# Patient Record
Sex: Female | Born: 1965
Health system: Southern US, Community
[De-identification: ages and names within clinical notes are randomized; demographics above are authoritative.]

## PROBLEM LIST (undated history)

## (undated) DIAGNOSIS — I10 Essential (primary) hypertension: Secondary | ICD-10-CM

## (undated) DIAGNOSIS — Z8759 Personal history of other complications of pregnancy, childbirth and the puerperium: Secondary | ICD-10-CM

## (undated) DIAGNOSIS — E559 Vitamin D deficiency, unspecified: Secondary | ICD-10-CM

## (undated) DIAGNOSIS — E039 Hypothyroidism, unspecified: Secondary | ICD-10-CM

## (undated) HISTORY — PX: OTHER SURGICAL HISTORY: SHX169

## (undated) HISTORY — DX: Vitamin D deficiency, unspecified: E55.9

## (undated) HISTORY — DX: Essential (primary) hypertension: I10

## (undated) HISTORY — PX: BACK SURGERY: SHX140

## (undated) HISTORY — DX: Hypothyroidism, unspecified: E03.9

## (undated) HISTORY — DX: Personal history of other complications of pregnancy, childbirth and the puerperium: Z87.59

---

## 2018-06-11 LAB — TSH: TSH: 6.17 — AB (ref 0.41–5.90)

## 2020-01-27 LAB — LIPID PANEL
Cholesterol: 184 (ref 0–200)
HDL: 43 (ref 35–70)
LDL Cholesterol: 113
Triglycerides: 142 (ref 40–160)

## 2020-01-27 LAB — VITAMIN D 25 HYDROXY (VIT D DEFICIENCY, FRACTURES): Vit D, 25-Hydroxy: 29

## 2020-01-27 LAB — COMPREHENSIVE METABOLIC PANEL
Calcium: 9.1 (ref 8.7–10.7)
GFR calc Af Amer: 68
GFR calc non Af Amer: 79

## 2020-01-27 LAB — CBC: RBC: 4.27 (ref 3.87–5.11)

## 2020-01-27 LAB — BASIC METABOLIC PANEL
BUN: 17 (ref 4–21)
CO2: 30 — AB (ref 13–22)
Chloride: 102 (ref 99–108)
Creatinine: 1 (ref 0.5–1.1)
Glucose: 90
Potassium: 4 (ref 3.4–5.3)
Sodium: 139 (ref 137–147)

## 2020-01-27 LAB — HEPATIC FUNCTION PANEL
ALT: 19 (ref 7–35)
AST: 18 (ref 13–35)
Alkaline Phosphatase: 69 (ref 25–125)
Bilirubin, Total: 0.2

## 2020-01-27 LAB — TSH: TSH: 2.39 (ref 0.41–5.90)

## 2020-01-27 LAB — CBC AND DIFFERENTIAL
HCT: 38 (ref 36–46)
Hemoglobin: 11.7 — AB (ref 12.0–16.0)
Platelets: 214 (ref 150–399)
WBC: 7.2

## 2020-01-27 LAB — VITAMIN B12: Vitamin B-12: 588

## 2020-05-06 LAB — RESULTS CONSOLE HPV: CHL HPV: NEGATIVE

## 2020-05-06 LAB — HM PAP SMEAR: HM Pap smear: NORMAL

## 2020-09-13 LAB — HM MAMMOGRAPHY

## 2021-03-06 DIAGNOSIS — E039 Hypothyroidism, unspecified: Secondary | ICD-10-CM | POA: Insufficient documentation

## 2021-03-06 DIAGNOSIS — E559 Vitamin D deficiency, unspecified: Secondary | ICD-10-CM | POA: Insufficient documentation

## 2021-03-06 DIAGNOSIS — Z8759 Personal history of other complications of pregnancy, childbirth and the puerperium: Secondary | ICD-10-CM | POA: Insufficient documentation

## 2021-03-06 DIAGNOSIS — I10 Essential (primary) hypertension: Secondary | ICD-10-CM | POA: Insufficient documentation

## 2021-03-07 ENCOUNTER — Other Ambulatory Visit: Payer: Self-pay

## 2021-03-07 ENCOUNTER — Ambulatory Visit (INDEPENDENT_AMBULATORY_CARE_PROVIDER_SITE_OTHER): Payer: BLUE CROSS/BLUE SHIELD | Admitting: Family Medicine

## 2021-03-07 ENCOUNTER — Encounter: Payer: Self-pay | Admitting: Family Medicine

## 2021-03-07 VITALS — BP 130/60 | HR 80 | Ht 64.0 in | Wt 182.0 lb

## 2021-03-07 DIAGNOSIS — Z7689 Persons encountering health services in other specified circumstances: Secondary | ICD-10-CM | POA: Diagnosis not present

## 2021-03-07 NOTE — Progress Notes (Signed)
Date:  03/07/2021   Name:  Stacey Casey   DOB:  04-Nov-1965   MRN:  [id number]   Chief Complaint: Establish Care  Patient is a 55 year old female who presents for a comprehensive physical exam. The patient reports the following problems: hypothyroid/ hypertension. Health maintenance has been reviewed up to date.     Lab Results  Component Value Date   CREATININE 1.0 01/27/2020   BUN 17 01/27/2020   NA 139 01/27/2020   K 4.0 01/27/2020   CL 102 01/27/2020   CO2 30 (A) 01/27/2020   Lab Results  Component Value Date   CHOL 184 01/27/2020   HDL 43 01/27/2020   LDLCALC 113 01/27/2020   TRIG 142 01/27/2020   Lab Results  Component Value Date   TSH 2.39 01/27/2020   No results found for: HGBA1C Lab Results  Component Value Date   WBC 7.2 01/27/2020   HGB 11.7 (A) 01/27/2020   HCT 38 01/27/2020   PLT 214 01/27/2020   Lab Results  Component Value Date   ALT 19 01/27/2020   AST 18 01/27/2020   ALKPHOS 69 01/27/2020     Review of Systems  Constitutional:  Negative for chills and fever.  HENT:  Negative for drooling, ear discharge, ear pain and sore throat.   Respiratory:  Negative for cough, shortness of breath and wheezing.   Cardiovascular:  Negative for chest pain, palpitations and leg swelling.  Gastrointestinal:  Negative for abdominal pain, blood in stool, constipation, diarrhea and nausea.  Endocrine: Negative for polydipsia.  Genitourinary:  Negative for dysuria, frequency, hematuria and urgency.  Musculoskeletal:  Negative for back pain, myalgias and neck pain.  Skin:  Negative for rash.  Allergic/Immunologic: Negative for environmental allergies.  Neurological:  Negative for dizziness and headaches.  Hematological:  Does not bruise/bleed easily.  Psychiatric/Behavioral:  Negative for suicidal ideas. The patient is not nervous/anxious.    Patient Active Problem List   Diagnosis Date Noted   Hypertension 03/06/2021   Hypothyroidism 03/06/2021    Vitamin D deficiency 03/06/2021   History of miscarriage 03/06/2021    Allergies  Allergen Reactions   Azithromycin Diarrhea    Past Surgical History:  Procedure Laterality Date   arthroscopy of knee     BACK SURGERY     ruptured disc in L5-S1   CESAREAN SECTION  03/2002    Social History   Tobacco Use   Smoking status: Never   Smokeless tobacco: Never  Vaping Use   Vaping Use: Never used  Substance Use Topics   Alcohol use: Yes    Comment: ocassionally   Drug use: Not Currently     Medication list has been reviewed and updated.  Current Meds  Medication Sig   calcium carbonate (TUMS - DOSED IN MG ELEMENTAL CALCIUM) 500 MG chewable tablet Chew 1 tablet by mouth daily.   levothyroxine (SYNTHROID) 75 MCG tablet Take 75 mcg by mouth daily before breakfast.   lisinopril-hydrochlorothiazide (ZESTORETIC) 20-12.5 MG tablet Take 1 tablet by mouth daily.   Multiple Vitamins-Minerals (MULTIVITAMIN WOMENS 50+ ADV PO) Take by mouth.    PHQ 2/9 Scores 03/07/2021  PHQ - 2 Score 0  PHQ- 9 Score 0    GAD 7 : Generalized Anxiety Score 03/07/2021  Nervous, Anxious, on Edge 0  Control/stop worrying 0  Worry too much - different things 0  Trouble relaxing 0  Restless 0  Easily annoyed or irritable 0  Afraid - awful might happen 0  Total GAD 7 Score 0    BP Readings from Last 3 Encounters:  03/07/21 130/60    Physical Exam Vitals and nursing note reviewed.  Constitutional:      General: She is not in acute distress.    Appearance: She is not diaphoretic.  HENT:     Head: Normocephalic and atraumatic.     Right Ear: Tympanic membrane, ear canal and external ear normal. There is no impacted cerumen.     Left Ear: Tympanic membrane, ear canal and external ear normal. There is no impacted cerumen.     Nose: Nose normal. No congestion or rhinorrhea.  Eyes:     General:        Right eye: No discharge.        Left eye: No discharge.     Conjunctiva/sclera: Conjunctivae  normal.     Pupils: Pupils are equal, round, and reactive to light.  Neck:     Thyroid: No thyromegaly.     Vascular: No JVD.  Cardiovascular:     Rate and Rhythm: Normal rate and regular rhythm.     Heart sounds: Normal heart sounds. No murmur heard.   No friction rub. No gallop.  Pulmonary:     Effort: Pulmonary effort is normal.     Breath sounds: Normal breath sounds. No wheezing or rhonchi.  Abdominal:     General: Bowel sounds are normal.     Palpations: Abdomen is soft. There is no mass.     Tenderness: There is no abdominal tenderness. There is no guarding.  Musculoskeletal:        General: Normal range of motion.     Cervical back: Normal range of motion and neck supple.  Lymphadenopathy:     Cervical: No cervical adenopathy.  Skin:    General: Skin is warm and dry.  Neurological:     Mental Status: She is alert.     Deep Tendon Reflexes: Reflexes are normal and symmetric.    Wt Readings from Last 3 Encounters:  03/07/21 182 lb (82.6 kg)    BP 130/60   Pulse 80   Ht 5\' 4"  (1.626 m)   Wt 182 lb (82.6 kg)   LMP 02/06/2021 (Exact Date)   BMI 31.24 kg/m   Assessment and Plan:  1. Establishing care with new doctor, encounter for Patient establishing care with new physician.  Patient has hypothyroidism and is supplemented with levothyroxine and is hypertensive and is currently on lisinopril.  Blood pressure is stable at this time and patient will return in a couple weeks for a matrix exam and refill of medications.

## 2021-03-16 ENCOUNTER — Encounter: Payer: Self-pay | Admitting: Family Medicine

## 2021-03-16 ENCOUNTER — Ambulatory Visit (INDEPENDENT_AMBULATORY_CARE_PROVIDER_SITE_OTHER): Payer: BLUE CROSS/BLUE SHIELD | Admitting: Family Medicine

## 2021-03-16 ENCOUNTER — Other Ambulatory Visit: Payer: Self-pay

## 2021-03-16 VITALS — BP 124/82 | HR 72 | Ht 64.0 in | Wt 179.0 lb

## 2021-03-16 DIAGNOSIS — E039 Hypothyroidism, unspecified: Secondary | ICD-10-CM | POA: Diagnosis not present

## 2021-03-16 DIAGNOSIS — Z0000 Encounter for general adult medical examination without abnormal findings: Secondary | ICD-10-CM

## 2021-03-16 NOTE — Progress Notes (Signed)
Date:  03/16/2021   Name:  Stacey Casey   DOB:  [birth year]   MRN:  [id number]   Chief Complaint: Annual Exam (biometric) and Hypothyroidism  Stacey Casey is a 55 y.o. female who presents today for her Complete Annual Exam. She feels well. She reports exercising three times a week. She reports she is sleeping well.    Thyroid Problem Presents for follow-up visit. Patient reports no anxiety, cold intolerance, constipation, depressed mood, diaphoresis, diarrhea, dry skin, fatigue, hair loss, heat intolerance, hoarse voice, leg swelling, menstrual problem, nail problem, palpitations, tremors, visual change, weight gain or weight loss. The symptoms have been stable.   Lab Results  Component Value Date   CREATININE 1.0 01/27/2020   BUN 17 01/27/2020   NA 139 01/27/2020   K 4.0 01/27/2020   CL 102 01/27/2020   CO2 30 (A) 01/27/2020   Lab Results  Component Value Date   CHOL 184 01/27/2020   HDL 43 01/27/2020   LDLCALC 113 01/27/2020   TRIG 142 01/27/2020   Lab Results  Component Value Date   TSH 2.39 01/27/2020   No results found for: HGBA1C Lab Results  Component Value Date   WBC 7.2 01/27/2020   HGB 11.7 (A) 01/27/2020   HCT 38 01/27/2020   PLT 214 01/27/2020   Lab Results  Component Value Date   ALT 19 01/27/2020   AST 18 01/27/2020   ALKPHOS 69 01/27/2020     Review of Systems  Constitutional:  Negative for chills, diaphoresis, fatigue, fever, unexpected weight change, weight gain and weight loss.  HENT: Negative.  Negative for hoarse voice.   Eyes: Negative.   Respiratory: Negative.    Cardiovascular: Negative.  Negative for palpitations.  Gastrointestinal: Negative.  Negative for constipation and diarrhea.  Endocrine: Negative.  Negative for cold intolerance and heat intolerance.  Genitourinary: Negative.  Negative for menstrual problem.  Musculoskeletal: Negative.   Allergic/Immunologic: Negative.   Neurological: Negative.  Negative for  tremors.  Hematological: Negative.   Psychiatric/Behavioral: Negative.  The patient is not nervous/anxious.    Patient Active Problem List   Diagnosis Date Noted   Hypertension 03/06/2021   Hypothyroidism 03/06/2021   Vitamin D deficiency 03/06/2021   History of miscarriage 03/06/2021    Allergies  Allergen Reactions   Azithromycin Diarrhea    Past Surgical History:  Procedure Laterality Date   arthroscopy of knee     BACK SURGERY     ruptured disc in L5-S1   CESAREAN SECTION  03/2002    Social History   Tobacco Use   Smoking status: Never   Smokeless tobacco: Never  Vaping Use   Vaping Use: Never used  Substance Use Topics   Alcohol use: Yes    Comment: ocassionally   Drug use: Not Currently     Medication list has been reviewed and updated.  Current Meds  Medication Sig   calcium carbonate (CALCIUM 600) 600 MG TABS tablet Take 600 mg by mouth daily with breakfast.   levothyroxine (SYNTHROID) 75 MCG tablet Take 75 mcg by mouth daily before breakfast.   lisinopril-hydrochlorothiazide (ZESTORETIC) 20-12.5 MG tablet Take 1 tablet by mouth daily.   Multiple Vitamins-Minerals (MULTIVITAMIN WOMENS 50+ ADV PO) Take by mouth.   [DISCONTINUED] calcium carbonate (TUMS - DOSED IN MG ELEMENTAL CALCIUM) 500 MG chewable tablet Chew 1 tablet by mouth daily.    PHQ 2/9 Scores 03/07/2021  PHQ - 2 Score 0  PHQ- 9 Score 0  GAD 7 : Generalized Anxiety Score 03/07/2021  Nervous, Anxious, on Edge 0  Control/stop worrying 0  Worry too much - different things 0  Trouble relaxing 0  Restless 0  Easily annoyed or irritable 0  Afraid - awful might happen 0  Total GAD 7 Score 0    BP Readings from Last 3 Encounters:  03/16/21 124/82  03/07/21 130/60    Physical Exam Vitals and nursing note reviewed.  Constitutional:      Appearance: She is well-developed.  HENT:     Head: Normocephalic.     Jaw: There is normal jaw occlusion.     Right Ear: Hearing, tympanic membrane  and external ear normal.     Left Ear: Hearing, tympanic membrane and external ear normal.     Nose: Nose normal. No congestion or rhinorrhea.     Mouth/Throat:     Lips: Pink.     Mouth: Mucous membranes are moist.     Dentition: Normal dentition.     Tongue: No lesions.     Palate: No mass.     Pharynx: Oropharynx is clear. Uvula midline.     Tonsils: No tonsillar exudate or tonsillar abscesses.  Eyes:     General: Lids are normal. Vision grossly intact. Gaze aligned appropriately. No scleral icterus.       Left eye: No foreign body or hordeolum.     Conjunctiva/sclera: Conjunctivae normal.     Right eye: Right conjunctiva is not injected.     Left eye: Left conjunctiva is not injected.     Pupils: Pupils are equal, round, and reactive to light.  Neck:     Thyroid: No thyroid mass, thyromegaly or thyroid tenderness.     Vascular: No JVD.     Trachea: No tracheal deviation.  Cardiovascular:     Rate and Rhythm: Normal rate and regular rhythm.     Pulses: Normal pulses.     Heart sounds: Normal heart sounds, S1 normal and S2 normal. No murmur heard. No systolic murmur is present.  No diastolic murmur is present.    No friction rub. No gallop. No S3 or S4 sounds.  Pulmonary:     Effort: Pulmonary effort is normal. No respiratory distress.     Breath sounds: Normal breath sounds. No decreased breath sounds, wheezing, rhonchi or rales.  Abdominal:     General: Bowel sounds are normal.     Palpations: Abdomen is soft. There is no hepatomegaly, splenomegaly or mass.     Tenderness: There is no abdominal tenderness. There is no guarding or rebound.  Musculoskeletal:        General: No tenderness. Normal range of motion.     Cervical back: Normal, normal range of motion and neck supple.     Thoracic back: Normal.     Lumbar back: Normal.     Right lower leg: No edema.     Left lower leg: No edema.  Lymphadenopathy:     Head:     Right side of head: No submental, submandibular or  tonsillar adenopathy.     Left side of head: No submental, submandibular or tonsillar adenopathy.     Cervical: No cervical adenopathy.     Right cervical: No superficial or deep cervical adenopathy.    Left cervical: No superficial or deep cervical adenopathy.     Upper Body:     Right upper body: No supraclavicular adenopathy.     Left upper body: No supraclavicular adenopathy.  Skin:  General: Skin is warm.     Findings: No rash.  Neurological:     General: No focal deficit present.     Mental Status: She is alert and oriented to person, place, and time.     Cranial Nerves: Cranial nerves are intact. No cranial nerve deficit.     Sensory: Sensation is intact.     Motor: Motor function is intact.     Deep Tendon Reflexes: Reflexes normal.     Reflex Scores:      Tricep reflexes are 2+ on the right side and 2+ on the left side.      Bicep reflexes are 2+ on the right side and 2+ on the left side.      Brachioradialis reflexes are 2+ on the right side and 2+ on the left side.      Patellar reflexes are 2+ on the right side and 2+ on the left side.      Achilles reflexes are 2+ on the right side and 2+ on the left side. Psychiatric:        Mood and Affect: Mood is not anxious or depressed.    Wt Readings from Last 3 Encounters:  03/16/21 179 lb (81.2 kg)  03/07/21 182 lb (82.6 kg)    BP 124/82   Pulse 72   Ht 5\' 4"  (1.626 m)   Wt 179 lb (81.2 kg)   LMP 02/06/2021 (Approximate)   BMI 30.73 kg/m   Assessment and Plan: Stacey Casey is a 55 y.o. female who presents today for her Complete Annual Exam. She feels well. She reports exercising daily. She reports she is sleeping well.  Patient's chart was reviewed previous encounters most recent labs most recent care everywhere. 1. Encounter for annual physical exam Immunizations are reviewed and recommendations provided.   Age appropriate screening tests are discussed. Counseling given for risk factor reduction  interventions.  No subjective/objective concerns noted during the history, review of systems, nor physical exam.  We will obtain lipid panel and renal function panel and nicotine screen of the urine per request of biometric instructions. - Lipid Panel With LDL/HDL Ratio - Renal Function Panel - Nicotine screen, urine  2. Hypothyroidism, unspecified type Chronic.  Controlled.  Stable.  Patient is tolerating levothyroxine 75 mcg at this time and pending thyroid function panel with TSH we will continue or adjust accordingly. - Thyroid Panel With TSH

## 2021-03-17 LAB — RENAL FUNCTION PANEL
Albumin: 4.5 g/dL (ref 3.8–4.9)
BUN/Creatinine Ratio: 15 (ref 9–23)
BUN: 15 mg/dL (ref 6–24)
CO2: 26 mmol/L (ref 20–29)
Calcium: 10.3 mg/dL — ABNORMAL HIGH (ref 8.7–10.2)
Chloride: 101 mmol/L (ref 96–106)
Creatinine, Ser: 1.01 mg/dL — ABNORMAL HIGH (ref 0.57–1.00)
Glucose: 82 mg/dL (ref 65–99)
Phosphorus: 3.3 mg/dL (ref 3.0–4.3)
Potassium: 4 mmol/L (ref 3.5–5.2)
Sodium: 141 mmol/L (ref 134–144)
eGFR: 67 mL/min/{1.73_m2} (ref >59)

## 2021-03-17 LAB — LIPID PANEL WITH LDL/HDL RATIO
Cholesterol, Total: 247 mg/dL — ABNORMAL HIGH (ref 100–199)
HDL: 49 mg/dL (ref >39)
LDL Chol Calc (NIH): 175 mg/dL — ABNORMAL HIGH (ref 0–99)
LDL/HDL Ratio: 3.6 ratio — ABNORMAL HIGH (ref 0.0–3.2)
Triglycerides: 125 mg/dL (ref 0–149)
VLDL Cholesterol Cal: 23 mg/dL (ref 5–40)

## 2021-03-17 LAB — NICOTINE SCREEN, URINE: Cotinine Ql Scrn, Ur: NEGATIVE ng/mL

## 2021-03-17 LAB — THYROID PANEL WITH TSH
Free Thyroxine Index: 1.9 (ref 1.2–4.9)
T3 Uptake Ratio: 25 % (ref 24–39)
T4, Total: 7.7 ug/dL (ref 4.5–12.0)
TSH: 3.53 u[IU]/mL (ref 0.450–4.500)

## 2021-03-17 NOTE — Progress Notes (Signed)
Pt viewed labs on Mychart. Called pt to see if she had any question. Told pt to call back if she did.  KP

## 2021-03-21 ENCOUNTER — Other Ambulatory Visit: Payer: Self-pay

## 2021-03-21 ENCOUNTER — Ambulatory Visit (INDEPENDENT_AMBULATORY_CARE_PROVIDER_SITE_OTHER): Payer: BLUE CROSS/BLUE SHIELD

## 2021-03-21 DIAGNOSIS — Z23 Encounter for immunization: Secondary | ICD-10-CM | POA: Diagnosis not present

## 2021-03-21 MED ORDER — LEVOTHYROXINE SODIUM 75 MCG PO TABS: 75.0000 ug | ORAL_TABLET | Freq: Every day | ORAL | 1 refills | Status: DC

## 2021-03-21 MED ORDER — LISINOPRIL-HYDROCHLOROTHIAZIDE 20-12.5 MG PO TABS: 1.0000 | ORAL_TABLET | Freq: Every day | ORAL | 1 refills | Status: DC

## 2021-03-21 NOTE — Progress Notes (Signed)
Patient came in today to receive Shingrix injection #1. Patient was brought to ROOM 1. Patient was given injection while sitting on the bed. Patient tolerated injection well and had no complaints. Patient informed of possible side effects and given VIS to take home.

## 2021-04-13 ENCOUNTER — Ambulatory Visit (INDEPENDENT_AMBULATORY_CARE_PROVIDER_SITE_OTHER): Payer: BLUE CROSS/BLUE SHIELD | Admitting: Family Medicine

## 2021-04-13 ENCOUNTER — Other Ambulatory Visit: Payer: Self-pay

## 2021-04-13 ENCOUNTER — Encounter: Payer: Self-pay | Admitting: Family Medicine

## 2021-04-13 VITALS — BP 130/80 | HR 72 | Ht 64.0 in | Wt 179.0 lb

## 2021-04-13 DIAGNOSIS — J302 Other seasonal allergic rhinitis: Secondary | ICD-10-CM

## 2021-04-13 DIAGNOSIS — R059 Cough, unspecified: Secondary | ICD-10-CM

## 2021-04-13 DIAGNOSIS — Z1211 Encounter for screening for malignant neoplasm of colon: Secondary | ICD-10-CM

## 2021-04-13 DIAGNOSIS — I10 Essential (primary) hypertension: Secondary | ICD-10-CM

## 2021-04-13 DIAGNOSIS — H6983 Other specified disorders of Eustachian tube, bilateral: Secondary | ICD-10-CM

## 2021-04-13 MED ORDER — BENZONATATE 200 MG PO CAPS
200.0000 mg | ORAL_CAPSULE | Freq: Two times a day (BID) | ORAL | 0 refills | Status: DC | PRN
Start: 2021-04-13 — End: 2021-05-15

## 2021-04-13 MED ORDER — LOSARTAN POTASSIUM-HCTZ 50-12.5 MG PO TABS: 1.0000 | ORAL_TABLET | Freq: Every day | ORAL | 0 refills | Status: DC

## 2021-04-13 NOTE — Progress Notes (Signed)
error 

## 2021-04-13 NOTE — Progress Notes (Signed)
Date:  04/13/2021   Name:  Stacey Casey   DOB:  06/03/1966   MRN:  [id number]   Chief Complaint: Cough (Has had cough that leads into a sinus infection or drainage for longer than she has been on the lisinopril. Once a year develops this cough)  Cough This is a chronic problem. The current episode started in the past 7 days. The problem has been waxing and waning. The problem occurs constantly. The cough is Non-productive. Associated symptoms include nasal congestion and postnasal drip. Pertinent negatives include no chest pain, chills, ear congestion, ear pain, fever, headaches, heartburn, hemoptysis, myalgias, rash, rhinorrhea, sore throat, shortness of breath, sweats, weight loss or wheezing. The symptoms are aggravated by lying down. The treatment provided mild relief. There is no history of environmental allergies.   Lab Results  Component Value Date   CREATININE 1.01 (H) 03/16/2021   BUN 15 03/16/2021   NA 141 03/16/2021   K 4.0 03/16/2021   CL 101 03/16/2021   CO2 26 03/16/2021   Lab Results  Component Value Date   CHOL 247 (H) 03/16/2021   HDL 49 03/16/2021   LDLCALC 175 (H) 03/16/2021   TRIG 125 03/16/2021   Lab Results  Component Value Date   TSH 3.530 03/16/2021   No results found for: HGBA1C Lab Results  Component Value Date   WBC 7.2 01/27/2020   HGB 11.7 (A) 01/27/2020   HCT 38 01/27/2020   PLT 214 01/27/2020   Lab Results  Component Value Date   ALT 19 01/27/2020   AST 18 01/27/2020   ALKPHOS 69 01/27/2020     Review of Systems  Constitutional:  Negative for chills, fever and weight loss.  HENT:  Positive for postnasal drip. Negative for drooling, ear discharge, ear pain, rhinorrhea and sore throat.   Respiratory:  Positive for cough. Negative for hemoptysis, shortness of breath and wheezing.   Cardiovascular:  Negative for chest pain, palpitations and leg swelling.  Gastrointestinal:  Negative for abdominal pain, blood in stool, constipation,  diarrhea, heartburn and nausea.  Endocrine: Negative for polydipsia.  Genitourinary:  Negative for dysuria, frequency, hematuria and urgency.  Musculoskeletal:  Negative for back pain, myalgias and neck pain.  Skin:  Negative for rash.  Allergic/Immunologic: Negative for environmental allergies.  Neurological:  Negative for dizziness and headaches.  Hematological:  Does not bruise/bleed easily.  Psychiatric/Behavioral:  Negative for suicidal ideas. The patient is not nervous/anxious.    Patient Active Problem List   Diagnosis Date Noted   Hypertension 03/06/2021   Hypothyroidism 03/06/2021   Vitamin D deficiency 03/06/2021   History of miscarriage 03/06/2021    Allergies  Allergen Reactions   Azithromycin Diarrhea    Past Surgical History:  Procedure Laterality Date   arthroscopy of knee     BACK SURGERY     ruptured disc in L5-S1   CESAREAN SECTION  03/2002    Social History   Tobacco Use   Smoking status: Never   Smokeless tobacco: Never  Vaping Use   Vaping Use: Never used  Substance Use Topics   Alcohol use: Yes    Comment: ocassionally   Drug use: Not Currently     Medication list has been reviewed and updated.  Current Meds  Medication Sig   calcium carbonate (OS-CAL) 600 MG TABS tablet Take 600 mg by mouth daily with breakfast.   levothyroxine (SYNTHROID) 75 MCG tablet Take 1 tablet (75 mcg total) by mouth daily before breakfast.  lisinopril-hydrochlorothiazide (ZESTORETIC) 20-12.5 MG tablet Take 1 tablet by mouth daily.   Multiple Vitamins-Minerals (MULTIVITAMIN WOMENS 50+ ADV PO) Take by mouth.    PHQ 2/9 Scores 03/07/2021  PHQ - 2 Score 0  PHQ- 9 Score 0    GAD 7 : Generalized Anxiety Score 03/07/2021  Nervous, Anxious, on Edge 0  Control/stop worrying 0  Worry too much - different things 0  Trouble relaxing 0  Restless 0  Easily annoyed or irritable 0  Afraid - awful might happen 0  Total GAD 7 Score 0    BP Readings from Last 3  Encounters:  04/13/21 130/80  03/16/21 124/82  03/07/21 130/60    Physical Exam Vitals and nursing note reviewed.  Constitutional:      Appearance: She is well-developed.  HENT:     Head: Normocephalic.     Right Ear: Ear canal and external ear normal. There is no impacted cerumen. Tympanic membrane is retracted.     Left Ear: Ear canal and external ear normal. There is no impacted cerumen. Tympanic membrane is retracted.     Nose: Congestion present.  Eyes:     General: Lids are everted, no foreign bodies appreciated. No scleral icterus.       Left eye: No foreign body or hordeolum.     Conjunctiva/sclera: Conjunctivae normal.     Right eye: Right conjunctiva is not injected.     Left eye: Left conjunctiva is not injected.     Pupils: Pupils are equal, round, and reactive to light.  Neck:     Thyroid: No thyromegaly.     Vascular: No JVD.     Trachea: No tracheal deviation.  Cardiovascular:     Rate and Rhythm: Normal rate and regular rhythm.     Heart sounds: Normal heart sounds. No murmur heard.   No friction rub. No gallop.  Pulmonary:     Effort: Pulmonary effort is normal. No respiratory distress.     Breath sounds: Normal breath sounds. No wheezing, rhonchi or rales.  Abdominal:     General: Bowel sounds are normal.     Palpations: Abdomen is soft. There is no mass.     Tenderness: There is no abdominal tenderness. There is no guarding or rebound.  Musculoskeletal:        General: No tenderness. Normal range of motion.     Cervical back: Normal range of motion and neck supple.  Lymphadenopathy:     Cervical: No cervical adenopathy.  Skin:    General: Skin is warm.     Findings: No rash.  Neurological:     Mental Status: She is alert and oriented to person, place, and time.     Cranial Nerves: No cranial nerve deficit.     Deep Tendon Reflexes: Reflexes normal.  Psychiatric:        Mood and Affect: Mood is not anxious or depressed.    Wt Readings from Last 3  Encounters:  04/13/21 179 lb (81.2 kg)  03/16/21 179 lb (81.2 kg)  03/07/21 182 lb (82.6 kg)    BP 130/80   Pulse 72   Ht 5\' 4"  (1.626 m)   Wt 179 lb (81.2 kg)   LMP 02/06/2021 (Exact Date)   BMI 30.73 kg/m   Assessment and Plan: 1. Cough New onset for this season.  Recurrent.  Described as a tickle in the back of the throat.  Having multiple clearing of the throat.  This is consistent with a postnasal drainage  and I have suggested the patient use some Sudafed 30 mg twice a day Nasacort or Flonase as directed and Mucinex DM.  In addition I have given her Tessalon Perles to suppress the cough during the day at work.  On auscultation of the lungs there is no wheezing and no increase in expiratory to inspiratory ratio so I do not think that its reactive airway disease at this time so unguinal hold on bronchodilation therapy.   2. Seasonal allergic rhinitis, unspecified trigger As noted above there may be a seasonal allergic rhinitis seen that ragweed is now starting to become the predominant pollen in the area.  We will treat with a combination of Mucinex DM nasal steroid Sudafed and Tessalon Perles - benzonatate (TESSALON) 200 MG capsule; Take 1 capsule (200 mg total) by mouth 2 (two) times daily as needed for cough.  Dispense: 30 capsule; Refill: 0  3. Dysfunction of both eustachian tubes As noted on exam and Sudafed has been suggested  4. Primary hypertension Continue.  There may be an ACE inhibitor effect going on here even though the cough seems to be remembered prior to going on ACE inhibitor.  I am going to switch over to the losartan hydrochlorothiazide combination of 50-12 0.5 and will recheck in 6 weeks to see if this may have also been an issue causing the tickling/cough sensation. - losartan-hydrochlorothiazide (HYZAAR) 50-12.5 MG tablet; Take 1 tablet by mouth daily.  Dispense: 60 tablet; Refill: 0  5. Colon cancer screening Gust with patient and she has agreed to do the FIT  testing. - Fecal occult blood, imunochemical([facility])

## 2021-04-18 ENCOUNTER — Telehealth: Payer: Self-pay

## 2021-04-18 NOTE — Telephone Encounter (Signed)
Called and reminded pt to turn in FIT test this week or next

## 2021-04-21 ENCOUNTER — Ambulatory Visit (INDEPENDENT_AMBULATORY_CARE_PROVIDER_SITE_OTHER): Payer: BLUE CROSS/BLUE SHIELD

## 2021-04-21 DIAGNOSIS — Z23 Encounter for immunization: Secondary | ICD-10-CM

## 2021-04-28 ENCOUNTER — Telehealth: Payer: BLUE CROSS/BLUE SHIELD | Admitting: Family Medicine

## 2021-04-28 ENCOUNTER — Other Ambulatory Visit: Payer: Self-pay

## 2021-04-28 DIAGNOSIS — I10 Essential (primary) hypertension: Secondary | ICD-10-CM

## 2021-04-28 MED ORDER — HYDROCHLOROTHIAZIDE 12.5 MG PO CAPS: 12.5000 mg | ORAL_CAPSULE | Freq: Every day | ORAL | 0 refills | Status: DC

## 2021-05-04 ENCOUNTER — Ambulatory Visit: Payer: BLUE CROSS/BLUE SHIELD | Admitting: Family Medicine

## 2021-05-15 ENCOUNTER — Other Ambulatory Visit: Payer: Self-pay

## 2021-05-15 ENCOUNTER — Ambulatory Visit (INDEPENDENT_AMBULATORY_CARE_PROVIDER_SITE_OTHER): Payer: BLUE CROSS/BLUE SHIELD | Admitting: Family Medicine

## 2021-05-15 ENCOUNTER — Encounter: Payer: Self-pay | Admitting: Family Medicine

## 2021-05-15 VITALS — BP 118/70 | HR 78 | Ht 64.0 in | Wt 180.0 lb

## 2021-05-15 DIAGNOSIS — I10 Essential (primary) hypertension: Secondary | ICD-10-CM

## 2021-05-15 DIAGNOSIS — E7801 Familial hypercholesterolemia: Secondary | ICD-10-CM

## 2021-05-15 MED ORDER — HYDROCHLOROTHIAZIDE 12.5 MG PO CAPS: 12.5000 mg | ORAL_CAPSULE | Freq: Every day | ORAL | 1 refills | Status: DC

## 2021-05-15 NOTE — Patient Instructions (Signed)

## 2021-05-15 NOTE — Progress Notes (Addendum)
Date:  05/15/2021   Name:  Stacey Casey   DOB:  [birth year]   MRN:  [id number]   Chief Complaint: Hypertension  Hypertension This is a chronic problem. The current episode started more than 1 year ago. The problem has been gradually improving since onset. The problem is controlled. Pertinent negatives include no anxiety, blurred vision, chest pain, headaches, malaise/fatigue, neck pain, orthopnea, palpitations, peripheral edema, PND, shortness of breath or sweats. There are no associated agents to hypertension. Past treatments include diuretics. The current treatment provides moderate improvement.   Lab Results  Component Value Date   CREATININE 1.01 (H) 03/16/2021   BUN 15 03/16/2021   NA 141 03/16/2021   K 4.0 03/16/2021   CL 101 03/16/2021   CO2 26 03/16/2021   Lab Results  Component Value Date   CHOL 247 (H) 03/16/2021   HDL 49 03/16/2021   LDLCALC 175 (H) 03/16/2021   TRIG 125 03/16/2021   Lab Results  Component Value Date   TSH 3.530 03/16/2021   No results found for: HGBA1C Lab Results  Component Value Date   WBC 7.2 01/27/2020   HGB 11.7 (A) 01/27/2020   HCT 38 01/27/2020   PLT 214 01/27/2020   Lab Results  Component Value Date   ALT 19 01/27/2020   AST 18 01/27/2020   ALKPHOS 69 01/27/2020     Review of Systems  Constitutional:  Negative for fatigue and malaise/fatigue.  Eyes:  Negative for blurred vision.  Respiratory:  Negative for shortness of breath and wheezing.   Cardiovascular:  Negative for chest pain, palpitations, orthopnea, leg swelling and PND.  Gastrointestinal:  Negative for constipation and nausea.  Musculoskeletal:  Negative for neck pain.  Neurological:  Negative for headaches.   Patient Active Problem List   Diagnosis Date Noted   Hypertension 03/06/2021   Hypothyroidism 03/06/2021   Vitamin D deficiency 03/06/2021   History of miscarriage 03/06/2021    Allergies  Allergen Reactions   Azithromycin Diarrhea    Past  Surgical History:  Procedure Laterality Date   arthroscopy of knee     BACK SURGERY     ruptured disc in L5-S1   CESAREAN SECTION  03/2002    Social History   Tobacco Use   Smoking status: Never   Smokeless tobacco: Never  Vaping Use   Vaping Use: Never used  Substance Use Topics   Alcohol use: Yes    Comment: ocassionally   Drug use: Not Currently     Medication list has been reviewed and updated.  Current Meds  Medication Sig   calcium carbonate (OS-CAL) 600 MG TABS tablet Take 600 mg by mouth daily with breakfast.   hydrochlorothiazide (MICROZIDE) 12.5 MG capsule Take 1 capsule (12.5 mg total) by mouth daily.   levothyroxine (SYNTHROID) 75 MCG tablet Take 1 tablet (75 mcg total) by mouth daily before breakfast.   Multiple Vitamins-Minerals (MULTIVITAMIN WOMENS 50+ ADV PO) Take by mouth.    PHQ 2/9 Scores 05/15/2021 03/07/2021  PHQ - 2 Score 0 0  PHQ- 9 Score 0 0    GAD 7 : Generalized Anxiety Score 05/15/2021 03/07/2021  Nervous, Anxious, on Edge 0 0  Control/stop worrying 0 0  Worry too much - different things 0 0  Trouble relaxing 0 0  Restless 0 0  Easily annoyed or irritable 0 0  Afraid - awful might happen 0 0  Total GAD 7 Score 0 0  Anxiety Difficulty Not difficult at all -  BP Readings from Last 3 Encounters:  05/15/21 118/70  04/13/21 130/80  03/16/21 124/82    Physical Exam Vitals and nursing note reviewed.  Constitutional:      General: She is not in acute distress.    Appearance: She is not diaphoretic.  HENT:     Head: Normocephalic and atraumatic.     Right Ear: Tympanic membrane and external ear normal.     Left Ear: Tympanic membrane and external ear normal.     Nose: Nose normal.  Eyes:     General:        Right eye: No discharge.        Left eye: No discharge.     Conjunctiva/sclera: Conjunctivae normal.     Pupils: Pupils are equal, round, and reactive to light.  Neck:     Thyroid: No thyromegaly.     Vascular: No JVD.   Cardiovascular:     Rate and Rhythm: Normal rate and regular rhythm.     Heart sounds: Normal heart sounds. No murmur heard.   No friction rub. No gallop.  Pulmonary:     Effort: Pulmonary effort is normal.     Breath sounds: Normal breath sounds. No wheezing or rhonchi.  Abdominal:     General: Bowel sounds are normal.     Palpations: Abdomen is soft. There is no mass.     Tenderness: There is no abdominal tenderness. There is no guarding.  Musculoskeletal:        General: Normal range of motion.     Cervical back: Normal range of motion and neck supple.  Lymphadenopathy:     Cervical: No cervical adenopathy.  Skin:    General: Skin is warm and dry.  Neurological:     Mental Status: She is alert.     Deep Tendon Reflexes: Reflexes are normal and symmetric.    Wt Readings from Last 3 Encounters:  05/15/21 180 lb (81.6 kg)  04/13/21 179 lb (81.2 kg)  03/16/21 179 lb (81.2 kg)    BP 118/70   Pulse 78   Ht 5\' 4"  (1.626 m)   Wt 180 lb (81.6 kg)   LMP 02/06/2021 (Exact Date)   SpO2 99%   BMI 30.90 kg/m   Assessment and Plan:  1. Primary hypertension Chronic.  Controlled.  Stable.  Blood pressure today is 118/70.  Patient has responded well to the hydrochlorothiazide 12.5 mg once a day.  Will check renal function panel for electrolytes and GFR and proceed to continue for 6 months and recheck in 6 months. - hydrochlorothiazide (MICROZIDE) 12.5 MG capsule; Take 1 capsule (12.5 mg total) by mouth daily.  Dispense: 90 capsule; Refill: 1 - Renal Function Panel  2. Familial hypercholesterolemia Fasting lipid noted elevated LDL we talked about a second risk factor that needs to be dealt with.  Patient has been given low-cholesterol triglyceride dietary guidelines and will follow these with the goal of losing a pound a week.  We will recheck in roughly 12 weeks to see if successful and we will repeat lipid panel fasting at that time.

## 2021-06-04 ENCOUNTER — Other Ambulatory Visit: Payer: Self-pay | Admitting: Family Medicine

## 2021-06-04 DIAGNOSIS — I10 Essential (primary) hypertension: Secondary | ICD-10-CM

## 2021-06-04 NOTE — Telephone Encounter (Signed)
dc'd 04/28/21 ineffective- Stacey Casey

## 2021-07-05 ENCOUNTER — Other Ambulatory Visit: Payer: Self-pay

## 2021-07-05 ENCOUNTER — Ambulatory Visit (INDEPENDENT_AMBULATORY_CARE_PROVIDER_SITE_OTHER): Payer: BLUE CROSS/BLUE SHIELD

## 2021-07-05 DIAGNOSIS — Z23 Encounter for immunization: Secondary | ICD-10-CM

## 2021-07-27 ENCOUNTER — Ambulatory Visit (INDEPENDENT_AMBULATORY_CARE_PROVIDER_SITE_OTHER): Payer: BLUE CROSS/BLUE SHIELD | Admitting: Family Medicine

## 2021-07-27 ENCOUNTER — Other Ambulatory Visit: Payer: Self-pay

## 2021-07-27 ENCOUNTER — Encounter: Payer: Self-pay | Admitting: Family Medicine

## 2021-07-27 ENCOUNTER — Ambulatory Visit: Payer: Self-pay

## 2021-07-27 VITALS — BP 124/88 | HR 96 | Temp 98.5°F | Ht 64.0 in | Wt 179.0 lb

## 2021-07-27 DIAGNOSIS — R6889 Other general symptoms and signs: Secondary | ICD-10-CM | POA: Diagnosis not present

## 2021-07-27 DIAGNOSIS — J0100 Acute maxillary sinusitis, unspecified: Secondary | ICD-10-CM

## 2021-07-27 LAB — POCT INFLUENZA A/B
Influenza A, POC: NEGATIVE
Influenza B, POC: NEGATIVE

## 2021-07-27 MED ORDER — AMOXICILLIN 500 MG PO CAPS: 500.0000 mg | ORAL_CAPSULE | Freq: Three times a day (TID) | ORAL | 0 refills | Status: AC

## 2021-07-27 NOTE — Progress Notes (Signed)
Date:  07/27/2021   Name:  Stacey Casey   DOB:  February 27, 1966   MRN:  [id number]   Chief Complaint: Cough  Cough This is a new (rhinorhea/coryza) problem. Episode onset: 3 days. The problem has been unchanged. The problem occurs every few hours. The cough is Productive of sputum and productive of purulent sputum. Associated symptoms include nasal congestion, postnasal drip and rhinorrhea. Pertinent negatives include no chest pain, chills, ear congestion, ear pain, fever, headaches, myalgias, rash, sore throat, shortness of breath or wheezing. Associated symptoms comments: sneezing. The symptoms are aggravated by lying down. She has tried nothing for the symptoms. The treatment provided moderate relief. There is no history of environmental allergies.   Lab Results  Component Value Date   NA 141 03/16/2021   K 4.0 03/16/2021   CO2 26 03/16/2021   GLUCOSE 82 03/16/2021   BUN 15 03/16/2021   CREATININE 1.01 (H) 03/16/2021   CALCIUM 10.3 (H) 03/16/2021   EGFR 67 03/16/2021   GFRNONAA 79 01/27/2020   Lab Results  Component Value Date   CHOL 247 (H) 03/16/2021   HDL 49 03/16/2021   LDLCALC 175 (H) 03/16/2021   TRIG 125 03/16/2021   Lab Results  Component Value Date   TSH 3.530 03/16/2021   No results found for: HGBA1C Lab Results  Component Value Date   WBC 7.2 01/27/2020   HGB 11.7 (A) 01/27/2020   HCT 38 01/27/2020   PLT 214 01/27/2020   Lab Results  Component Value Date   ALT 19 01/27/2020   AST 18 01/27/2020   ALKPHOS 69 01/27/2020   Lab Results  Component Value Date   VD25OH 29 01/27/2020     Review of Systems  Constitutional:  Negative for chills and fever.  HENT:  Positive for postnasal drip and rhinorrhea. Negative for drooling, ear discharge, ear pain and sore throat.   Respiratory:  Positive for cough. Negative for shortness of breath and wheezing.   Cardiovascular:  Negative for chest pain, palpitations and leg swelling.  Gastrointestinal:  Negative  for abdominal pain, blood in stool, constipation, diarrhea and nausea.  Endocrine: Negative for polydipsia.  Genitourinary:  Negative for dysuria, frequency, hematuria and urgency.  Musculoskeletal:  Negative for back pain, myalgias and neck pain.  Skin:  Negative for rash.  Allergic/Immunologic: Negative for environmental allergies.  Neurological:  Negative for dizziness and headaches.  Hematological:  Does not bruise/bleed easily.  Psychiatric/Behavioral:  Negative for suicidal ideas. The patient is not nervous/anxious.    Patient Active Problem List   Diagnosis Date Noted   Hypertension 03/06/2021   Hypothyroidism 03/06/2021   Vitamin D deficiency 03/06/2021   History of miscarriage 03/06/2021    Allergies  Allergen Reactions   Azithromycin Diarrhea    Past Surgical History:  Procedure Laterality Date   arthroscopy of knee     BACK SURGERY     ruptured disc in L5-S1   CESAREAN SECTION  03/2002    Social History   Tobacco Use   Smoking status: Never   Smokeless tobacco: Never  Vaping Use   Vaping Use: Never used  Substance Use Topics   Alcohol use: Yes    Comment: ocassionally   Drug use: Not Currently     Medication list has been reviewed and updated.  Current Meds  Medication Sig   calcium carbonate (OS-CAL) 600 MG TABS tablet Take 600 mg by mouth daily with breakfast.   hydrochlorothiazide (MICROZIDE) 12.5 MG capsule Take 1  capsule (12.5 mg total) by mouth daily.   levothyroxine (SYNTHROID) 75 MCG tablet Take 1 tablet (75 mcg total) by mouth daily before breakfast.   Multiple Vitamins-Minerals (MULTIVITAMIN WOMENS 50+ ADV PO) Take by mouth.    PHQ 2/9 Scores 05/15/2021 03/07/2021  PHQ - 2 Score 0 0  PHQ- 9 Score 0 0    GAD 7 : Generalized Anxiety Score 05/15/2021 03/07/2021  Nervous, Anxious, on Edge 0 0  Control/stop worrying 0 0  Worry too much - different things 0 0  Trouble relaxing 0 0  Restless 0 0  Easily annoyed or irritable 0 0  Afraid -  awful might happen 0 0  Total GAD 7 Score 0 0  Anxiety Difficulty Not difficult at all -    BP Readings from Last 3 Encounters:  05/15/21 118/70  04/13/21 130/80  03/16/21 124/82    Physical Exam Vitals and nursing note reviewed.  Constitutional:      General: She is not in acute distress.    Appearance: She is not diaphoretic.  HENT:     Head: Normocephalic and atraumatic.     Right Ear: Hearing, tympanic membrane, ear canal and external ear normal. There is no impacted cerumen.     Left Ear: Hearing, tympanic membrane, ear canal and external ear normal. There is no impacted cerumen.     Nose: Nose normal. No congestion or rhinorrhea.     Right Sinus: No maxillary sinus tenderness or frontal sinus tenderness.     Left Sinus: No maxillary sinus tenderness or frontal sinus tenderness.     Mouth/Throat:     Mouth: Mucous membranes are moist.  Eyes:     General:        Right eye: No discharge.        Left eye: No discharge.     Conjunctiva/sclera: Conjunctivae normal.     Pupils: Pupils are equal, round, and reactive to light.  Neck:     Thyroid: No thyromegaly.     Vascular: No JVD.  Cardiovascular:     Rate and Rhythm: Normal rate and regular rhythm.     Heart sounds: Normal heart sounds, S1 normal and S2 normal. No murmur heard. No systolic murmur is present.  No diastolic murmur is present.    No friction rub. No gallop. No S3 or S4 sounds.  Pulmonary:     Effort: Pulmonary effort is normal.     Breath sounds: Normal breath sounds. No wheezing or rhonchi.  Abdominal:     General: Bowel sounds are normal.     Palpations: Abdomen is soft. There is no mass.     Tenderness: There is no abdominal tenderness. There is no guarding or rebound.  Musculoskeletal:        General: Normal range of motion.     Cervical back: Normal range of motion and neck supple.  Lymphadenopathy:     Cervical: No cervical adenopathy.  Skin:    General: Skin is warm and dry.     Capillary  Refill: Capillary refill takes less than 2 seconds.  Neurological:     Mental Status: She is alert.     Deep Tendon Reflexes: Reflexes are normal and symmetric.    Wt Readings from Last 3 Encounters:  07/27/21 179 lb (81.2 kg)  05/15/21 180 lb (81.6 kg)  04/13/21 179 lb (81.2 kg)    Ht 5' 4" (1.626 m)    Wt 179 lb (81.2 kg)    BMI 30.73 kg/m  Assessment and Plan:  1. Flu-like symptoms Symptoms are new onset patient is getting ready to [place] to [place].  COVID is noted to be negative and I have encouraged her to repeat the test prior to travel influenza test is negative for AMB.  Still there is unlikely that a viral component to this. - POCT Influenza A/B  2. Acute maxillary sinusitis, recurrence not specified New onset.  Persistent.  There is no tenderness over the maxillary sinuses but there is some mild yellow postnasal discharge.  We will treat with amoxicillin 500 mg 3 times a day for 10 days for suspected sinusitis. - amoxicillin (AMOXIL) 500 MG capsule; Take 1 capsule (500 mg total) by mouth 3 (three) times daily for 10 days.  Dispense: 30 capsule; Refill: 0

## 2021-07-27 NOTE — Telephone Encounter (Signed)
°  Chief Complaint: nasal congestion- getting ready to fly out of country Symptoms: nasal congestion- turning from clear to yellow- throat irritation Frequency: 3 days Pertinent Negatives: Patient denies fever, cough Disposition: [] ED /[] Urgent Care (no appt availability in office) / [x] Appointment(In office/virtual)/ []  [facility]/ [] Home Care/ [] Refused Recommended Disposition  Additional Notes: Patient is concerned about flying out of country and getting sick- appointment made with provider to discuss treatment and medications for trip.   Reason for Disposition  Care advice for mild cough, questions about  Zinc, vitamin C, and echinacea to treat the common cold, questions about  Answer Assessment - Initial Assessment Questions 1. ONSET: "When did the nasal discharge start?"      3 days ago 2. AMOUNT: "How much discharge is there?"      Patient is clearing nasal passages 3. COUGH: "Do you have a cough?" If yes, ask: "Describe the color of your sputum" (clear, white, yellow, green)     no 4. RESPIRATORY DISTRESS: "Describe your breathing."      normal 5. FEVER: "Do you have a fever?" If Yes, ask: "What is your temperature, how was it measured, and when did it start?"     no 6. SEVERITY: "Overall, how bad are you feeling right now?" (e.g., doesn't interfere with normal activities, staying home from school/work, staying in bed)      Patient is just having nasal symptoms- so throat iiritation from drainage- but not bad 7. OTHER SYMPTOMS: "Do you have any other symptoms?" (e.g., sore throat, earache, wheezing, vomiting)     Throat irritation  8. PREGNANCY: "Is there any chance you are pregnant?" "When was your last menstrual period?"     na  Protocols used: Common Cold-A-AH

## 2021-07-27 NOTE — Telephone Encounter (Signed)
Pt called saying she has what she calls and bad cold and she is traveling on Saturday on plane.  She wants to know if a nurse can call her back and give her some advise.   Left message to call back.

## 2021-09-18 ENCOUNTER — Other Ambulatory Visit: Payer: Self-pay | Admitting: Family Medicine

## 2021-09-18 NOTE — Telephone Encounter (Signed)
Requested Prescriptions  Pending Prescriptions Disp Refills   levothyroxine (SYNTHROID) 75 MCG tablet [Pharmacy Med Name: LEVOTHYROXINE 75 MCG TABLET] 90 tablet 1    Sig: TAKE 1 TABLET BY MOUTH DAILY BEFORE BREAKFAST.     Endocrinology:  Hypothyroid Agents Passed - 09/18/2021  1:34 AM      Passed - TSH in normal range and within 360 days    TSH  Date Value Ref Range Status  03/16/2021 3.530 0.450 - 4.500 uIU/mL Final         Passed - Valid encounter within last 12 months    Recent Outpatient Visits          1 month ago Flu-like symptoms   [facility] Duanne Limerick, MD   4 months ago Primary hypertension   [facility] Duanne Limerick, MD   5 months ago Cough   [facility] Duanne Limerick, MD   6 months ago Encounter for annual physical exam   [facility] Duanne Limerick, MD   6 months ago Establishing care with new doctor, encounter for   [facility] Duanne Limerick, MD      Future Appointments            In 1 month Duanne Limerick, MD [facility], [facility]

## 2021-10-17 ENCOUNTER — Other Ambulatory Visit: Payer: Self-pay

## 2021-10-17 ENCOUNTER — Encounter: Payer: Self-pay | Admitting: Family Medicine

## 2021-10-17 ENCOUNTER — Ambulatory Visit (INDEPENDENT_AMBULATORY_CARE_PROVIDER_SITE_OTHER): Payer: BLUE CROSS/BLUE SHIELD | Admitting: Family Medicine

## 2021-10-17 VITALS — BP 136/80 | HR 80 | Ht 64.0 in | Wt 180.0 lb

## 2021-10-17 DIAGNOSIS — Z6830 Body mass index (BMI) 30.0-30.9, adult: Secondary | ICD-10-CM

## 2021-10-17 DIAGNOSIS — I10 Essential (primary) hypertension: Secondary | ICD-10-CM

## 2021-10-17 DIAGNOSIS — Z1211 Encounter for screening for malignant neoplasm of colon: Secondary | ICD-10-CM

## 2021-10-17 DIAGNOSIS — E7801 Familial hypercholesterolemia: Secondary | ICD-10-CM | POA: Diagnosis not present

## 2021-10-17 DIAGNOSIS — E034 Atrophy of thyroid (acquired): Secondary | ICD-10-CM | POA: Diagnosis not present

## 2021-10-17 MED ORDER — NA SULFATE-K SULFATE-MG SULF 17.5-3.13-1.6 GM/177ML PO SOLN
1.0000 | Freq: Once | ORAL | 0 refills | Status: AC
Start: 2021-10-17 — End: 2021-10-17

## 2021-10-17 MED ORDER — LEVOTHYROXINE SODIUM 75 MCG PO TABS: 75.0000 ug | ORAL_TABLET | Freq: Every day | ORAL | 1 refills | Status: DC

## 2021-10-17 MED ORDER — HYDROCHLOROTHIAZIDE 12.5 MG PO CAPS: 12.5000 mg | ORAL_CAPSULE | Freq: Every day | ORAL | 1 refills | Status: DC

## 2021-10-17 NOTE — Progress Notes (Signed)
? ? ?Date:  10/17/2021  ? ?Name:  Stacey Casey   DOB:  [birth year]   MRN:  [id number] ? ? ?Chief Complaint: Hypertension (Bp has been running slightly high on the bottom) and Hypothyroidism ? ?Hypertension ?This is a chronic problem. The current episode started more than 1 year ago. The problem has been gradually improving (not controlled) since onset. The problem is uncontrolled. Pertinent negatives include no anxiety, blurred vision, chest pain, headaches, malaise/fatigue, neck pain, orthopnea, palpitations, peripheral edema, PND, shortness of breath or sweats. There are no associated agents to hypertension. Past treatments include diuretics. The current treatment provides moderate improvement. There are no compliance problems.  There is no history of angina, kidney disease, CAD/MI, CVA, heart failure, left ventricular hypertrophy, PVD or retinopathy. There is no history of chronic renal disease, a hypertension causing med or renovascular disease.  ? ?Lab Results  ?Component Value Date  ? NA 141 03/16/2021  ? K 4.0 03/16/2021  ? CO2 26 03/16/2021  ? GLUCOSE 82 03/16/2021  ? BUN 15 03/16/2021  ? CREATININE 1.01 (H) 03/16/2021  ? CALCIUM 10.3 (H) 03/16/2021  ? EGFR 67 03/16/2021  ? GFRNONAA 79 01/27/2020  ? ?Lab Results  ?Component Value Date  ? CHOL 247 (H) 03/16/2021  ? HDL 49 03/16/2021  ? LDLCALC 175 (H) 03/16/2021  ? TRIG 125 03/16/2021  ? ?Lab Results  ?Component Value Date  ? TSH 3.530 03/16/2021  ? ?No results found for: HGBA1C ?Lab Results  ?Component Value Date  ? WBC 7.2 01/27/2020  ? HGB 11.7 (A) 01/27/2020  ? HCT 38 01/27/2020  ? PLT 214 01/27/2020  ? ?Lab Results  ?Component Value Date  ? ALT 19 01/27/2020  ? AST 18 01/27/2020  ? ALKPHOS 69 01/27/2020  ? ?Lab Results  ?Component Value Date  ? VD25OH 29 01/27/2020  ?  ? ?Review of Systems  ?Constitutional:  Negative for chills, fever and malaise/fatigue.  ?HENT:  Negative for drooling, ear discharge, ear pain and sore throat.   ?Eyes:  Negative for  blurred vision.  ?Respiratory:  Negative for cough, shortness of breath and wheezing.   ?Cardiovascular:  Negative for chest pain, palpitations, orthopnea, leg swelling and PND.  ?Gastrointestinal:  Negative for abdominal pain, blood in stool, constipation, diarrhea and nausea.  ?Endocrine: Negative for polydipsia.  ?Genitourinary:  Negative for dysuria, frequency, hematuria and urgency.  ?Musculoskeletal:  Negative for back pain, myalgias and neck pain.  ?Skin:  Negative for rash.  ?Allergic/Immunologic: Negative for environmental allergies.  ?Neurological:  Negative for dizziness and headaches.  ?Hematological:  Does not bruise/bleed easily.  ?Psychiatric/Behavioral:  Negative for suicidal ideas. The patient is not nervous/anxious.   ? ?Patient Active Problem List  ? Diagnosis Date Noted  ? Hypertension 03/06/2021  ? Hypothyroidism 03/06/2021  ? Vitamin D deficiency 03/06/2021  ? History of miscarriage 03/06/2021  ? ? ?Allergies  ?Allergen Reactions  ? Azithromycin Diarrhea  ? ? ?Past Surgical History:  ?Procedure Laterality Date  ? arthroscopy of knee    ? BACK SURGERY    ? ruptured disc in L5-S1  ? CESAREAN SECTION  03/2002  ? ? ?Social History  ? ?Tobacco Use  ? Smoking status: Never  ? Smokeless tobacco: Never  ?Vaping Use  ? Vaping Use: Never used  ?Substance Use Topics  ? Alcohol use: Yes  ?  Comment: ocassionally  ? Drug use: Not Currently  ? ? ? ?Medication list has been reviewed and updated. ? ?Current Meds  ?Medication Sig  ?  calcium carbonate (OS-CAL) 600 MG TABS tablet Take 600 mg by mouth daily with breakfast.  ? hydrochlorothiazide (MICROZIDE) 12.5 MG capsule Take 1 capsule (12.5 mg total) by mouth daily.  ? levothyroxine (SYNTHROID) 75 MCG tablet TAKE 1 TABLET BY MOUTH DAILY BEFORE BREAKFAST.  ? Multiple Vitamins-Minerals (MULTIVITAMIN WOMENS 50+ ADV PO) Take by mouth.  ? ? ?PHQ 2/9 Scores 10/17/2021 07/27/2021 05/15/2021 03/07/2021  ?PHQ - 2 Score 0 0 0 0  ?PHQ- 9 Score 0 1 0 0  ? ? ?GAD 7 :  Generalized Anxiety Score 10/17/2021 07/27/2021 05/15/2021 03/07/2021  ?Nervous, Anxious, on Edge 0 0 0 0  ?Control/stop worrying 0 0 0 0  ?Worry too much - different things 0 0 0 0  ?Trouble relaxing 0 0 0 0  ?Restless 0 0 0 0  ?Easily annoyed or irritable 0 0 0 0  ?Afraid - awful might happen 0 0 0 0  ?Total GAD 7 Score 0 0 0 0  ?Anxiety Difficulty Not difficult at all Not difficult at all Not difficult at all -  ? ? ?BP Readings from Last 3 Encounters:  ?10/17/21 (!) 130/92  ?07/27/21 124/88  ?05/15/21 118/70  ? ? ?Physical Exam ?Vitals and nursing note reviewed.  ?Constitutional:   ?   Appearance: She is well-developed.  ?HENT:  ?   Head: Normocephalic.  ?   Right Ear: Tympanic membrane, ear canal and external ear normal. There is no impacted cerumen.  ?   Left Ear: Tympanic membrane, ear canal and external ear normal. There is no impacted cerumen.  ?   Nose: Nose normal. No congestion or rhinorrhea.  ?Eyes:  ?   General: Lids are everted, no foreign bodies appreciated. No scleral icterus.    ?   Left eye: No foreign body or hordeolum.  ?   Conjunctiva/sclera: Conjunctivae normal.  ?   Right eye: Right conjunctiva is not injected.  ?   Left eye: Left conjunctiva is not injected.  ?   Pupils: Pupils are equal, round, and reactive to light.  ?Neck:  ?   Thyroid: No thyromegaly.  ?   Vascular: No JVD.  ?   Trachea: No tracheal deviation.  ?Cardiovascular:  ?   Rate and Rhythm: Normal rate and regular rhythm.  ?   Heart sounds: Normal heart sounds. No murmur heard. ?  No friction rub. No gallop.  ?Pulmonary:  ?   Effort: Pulmonary effort is normal. No respiratory distress.  ?   Breath sounds: Normal breath sounds. No wheezing, rhonchi or rales.  ?Chest:  ?   Chest wall: No tenderness.  ?Abdominal:  ?   General: Bowel sounds are normal.  ?   Palpations: Abdomen is soft. There is no mass.  ?   Tenderness: There is no abdominal tenderness. There is no guarding or rebound.  ?Musculoskeletal:     ?   General: No tenderness.  Normal range of motion.  ?   Cervical back: Normal range of motion and neck supple.  ?Lymphadenopathy:  ?   Cervical: No cervical adenopathy.  ?Skin: ?   General: Skin is warm.  ?   Findings: No bruising, erythema or rash.  ?Neurological:  ?   Mental Status: She is alert and oriented to person, place, and time.  ?   Cranial Nerves: No cranial nerve deficit.  ?   Deep Tendon Reflexes: Reflexes normal.  ?Psychiatric:     ?   Mood and Affect: Mood is not anxious or depressed.  ? ? ?  Wt Readings from Last 3 Encounters:  ?10/17/21 180 lb (81.6 kg)  ?07/27/21 179 lb (81.2 kg)  ?05/15/21 180 lb (81.6 kg)  ? ? ?BP (!) 130/92   Pulse 80   Ht _0  (1.626 m)   Wt 180 lb (81.6 kg)   LMP 02/06/2021   BMI 30.90 kg/m?  ? ?Assessment and Plan: ? ?1. Primary hypertension ?Chronic.  Controlled.  Stable.  Continue hydrochlorothiazide 12.5 mg once a day.  Blood pressure on repeat in the sitting proper position was 136/80.  We will check renal function panel for electrolytes and GFR. ?- hydrochlorothiazide (MICROZIDE) 12.5 MG capsule; Take 1 capsule (12.5 mg total) by mouth daily.  Dispense: 90 capsule; Refill: 1 ?- Renal Function Panel ? ?2. Hypothyroidism due to acquired atrophy of thyroid ?Chronic.  Controlled.  Stable.  Will check TSH and pending reading we will likely continue at levothyroxine 75 mcg daily. ?- levothyroxine (SYNTHROID) 75 MCG tablet; Take 1 tablet (75 mcg total) by mouth daily before breakfast.  Dispense: 90 tablet; Refill: 1 ?- TSH ? ?3. Familial hypercholesterolemia ?Chronic.  Controlled.  Stable.  Patient will continue to watch cholesterol and triglycerides with low-cholesterol dietary sheet and lipid panel will verify current status of LDL. ?- Lipid Panel With LDL/HDL Ratio ? ?4. Colon cancer screening ?Patient is referred for colon cancer screening with gastroenterology. ?- Ambulatory referral to Gastroenterology ? ?5. BMI 30.0-30.9,adult ?Health risks of being over weight were discussed and patient was  counseled on weight loss options and exercise.   ? ? ?

## 2021-10-17 NOTE — Patient Instructions (Addendum)
How to Take Your Blood Pressure ?Blood pressure measures how strongly your blood is pressing against the walls of your arteries. Arteries are blood vessels that carry blood from your heart throughout your body. You can take your blood pressure at home with a machine. ?You may need to check your blood pressure at home: ?To check if you have high blood pressure (hypertension). ?To check your blood pressure over time. ?To make sure your blood pressure medicine is working. ?Supplies needed: ?Blood pressure machine, or monitor. ?Dining room chair to sit in. ?Table or desk. ?Small notebook. ?Pencil or pen. ?How to prepare ?Avoid these things for 30 minutes before checking your blood pressure: ?Having drinks with caffeine in them, such as coffee or tea. ?Drinking alcohol. ?Eating. ?Smoking. ?Exercising. ?Do these things five minutes before checking your blood pressure: ?Go to the bathroom and pee (urinate). ?Sit in a dining chair. Do not sit on a soft couch or an armchair. ?Be quiet. Do not talk. ?How to take your blood pressure ?Follow the instructions that came with your machine. If you have a digital blood pressure monitor, these may be the instructions: ?Sit up straight. ?Place your feet on the floor. Do not cross your ankles or legs. ?Rest your left arm at the level of your heart. You may rest it on a table, desk, or chair. ?Pull up your shirt sleeve. ?Wrap the blood pressure cuff around the upper part of your left arm. The cuff should be 1 inch (2.5 cm) above your elbow. It is best to wrap the cuff around bare skin. ?Fit the cuff snugly around your arm. You should be able to place only one finger between the cuff and your arm. ?Place the cord so that it rests in the bend of your elbow. ?Press the power button. ?Sit quietly while the cuff fills with air and loses air. ?Write down the numbers on the screen. ?Wait 2-3 minutes and then repeat steps 1-10. ?What do the numbers mean? ?Two numbers make up your blood  pressure. The first number is called systolic pressure. The second is called diastolic pressure. An example of a blood pressure reading is "120 over 80" (or 120/80). ?If you are an adult and do not have a medical condition, use this guide to find out if your blood pressure is normal: ?Normal ?First number: below 120. ?Second number: below 80. ?Elevated ?First number: 120-129. ?Second number: below 80. ?Hypertension stage 1 ?First number: 130-139. ?Second number: 80-89. ?Hypertension stage 2 ?First number: 140 or above. ?Second number: 90 or above. ?Your blood pressure is above normal even if only the first or only the second number is above normal. ?Follow these instructions at home: ?Medicines ?Take over-the-counter and prescription medicines only as told by your doctor. ?Tell your doctor if your medicine is causing side effects. ?General instructions ?Check your blood pressure as often as your doctor tells you to. ?Check your blood pressure at the same time every day. ?Take your monitor to your next doctor's appointment. Your doctor will: ?Make sure you are using it correctly. ?Make sure it is working right. ?Understand what your blood pressure numbers should be. ?Keep all follow-up visits as told by your doctor. This is important. ?General tips ?You will need a blood pressure machine, or monitor. Your doctor can suggest a monitor. You can buy one at a drugstore or online. When choosing one: ?Choose one with an arm cuff. ?Choose one that wraps around your upper arm. Only one finger should fit between   your arm and the cuff. ?Do not choose one that measures your blood pressure from your wrist or finger. ?Where to find more information ?American Heart Association: www.heart.org ?Contact a doctor if: ?Your blood pressure keeps being high. ?Your blood pressure is suddenly low. ?Get help right away if: ?Your first blood pressure number is higher than 180. ?Your second blood pressure number is higher than  120. ?Summary ?Check your blood pressure at the same time every day. ?Avoid caffeine, alcohol, smoking, and exercise for 30 minutes before checking your blood pressure. ?Make sure you understand what your blood pressure numbers should be. ?This information is not intended to replace advice given to you by your health care provider. Make sure you discuss any questions you have with your health care provider. ?Document Revised: 06/01/2020 Document Reviewed: 07/17/2019 ?Elsevier Patient Education ? 2022 Elsevier Inc. ?GUIDELINES FOR  ?LOW-CHOLESTEROL, LOW-TRIGLYCERIDE DIETS  ?  ?FOODS TO USE  ? ?MEATS, FISH Choose lean meats (chicken, [place], veal, and non-fatty cuts of beef with excess fat trimmed; one serving = 3 oz of cooked meat). Also, fresh or frozen fish, canned fish packed in water, and shellfish (lobster, crabs, shrimp, and oysters). Limit use to no more than one serving of one of these per week. Shellfish are high in cholesterol but low in saturated fat and should be used sparingly. Meats and fish should be broiled (pan or oven) or baked on a rack.  ?EGGS Egg substitutes and egg whites (use freely). Egg yolks (limit two per week).  ?FRUITS Eat three servings of fresh fruit per day (1 serving = ? cup). Be sure to have at least one citrus fruit daily. Frozen and canned fruit with no sugar or syrup added may be used.  ?VEGETABLES Most vegetables are not limited (see next page). One dark-green (string beans, escarole) or one deep yellow (squash) vegetable is recommended daily. Cauliflower, broccoli, and celery, as well as potato skins, are recommended for their fiber content. (Fiber is associated with cholesterol reduction) It is preferable to steam vegetables, but they may be boiled, strained, or braised with polyunsaturated vegetable oil (see below).  ?BEANS Dried peas or beans (1 serving = ? cup) may be used as a bread substitute.  ?NUTS Almonds, walnuts, and peanuts may be used sparingly  ?(1 serving = 1  Tablespoonful). Use pumpkin, sesame, or sunflower seeds.  ?BREADS, GRAINS One roll or one slice of whole grain or enriched bread may be used, or three soda crackers or four pieces of melba toast as a substitute. Spaghetti, rice or noodles (? cup) or ? large ear of corn may be used as a bread substitute. In preparing these foods do not use butter or shortening, use soft margarine. Also use egg and sugar substitutes.  Choose high fiber grains, such as oats and whole wheat.  ?CEREALS Use ? cup of hot cereal or ? cup of cold cereal per day. Add a sugar substitute if desired, with 99% fat free or skim milk.  ?MILK PRODUCTS Always use 99% fat free or skim milk, dairy products such as low fat cheeses (farmer's uncreamed diet cottage), low-fat yogurt, and powdered skim milk.  ?FATS, OILS Use soft (not stick) margarine; vegetable oils that are high in polyunsaturated fats (such as safflower, sunflower, soybean, corn, and cottonseed). Always refrigerate meat drippings to harden the fat and remove it before preparing gravies  ?DESSERTS, SNACKS Limit to two servings per day; substitute each serving for a bread/cereal serving: ice milk, water sherbet (1/4 cup);  unflavored gelatin or gelatin flavored with sugar substitute (1/3 cup); pudding prepared with skim milk (1/2 cup); egg white souffl?s; unbuttered popcorn (1 ? cups). Substitute carob for chocolate.  ?BEVERAGES Fresh fruit juices (limit 4 oz per day); black coffee, plain or herbal teas; soft drinks with sugar substitutes; club soda, preferably salt-free; cocoa made with skim milk or nonfat dried milk and water (sugar substitute added if desired); clear broth. Alcohol: limit two servings per day (see second page).  ?MISCELLANEOUS ? You may use the following freely: vinegar, spices, herbs, nonfat bouillon, mustard, Worcestershire sauce, soy sauce, flavoring essence.  ? ? ? ? ? ? ? ? ? ? ? ? ? ? ? ? ?GUIDELINES FOR  ?LOW-CHOLESTEROL, LOW TRIGLYCERIDE DIETS  ?  ?FOODS TO AVOID   ? ?MEATS, FISH Marbled beef, pork, bacon, sausage, and other pork products; fatty fowl (duck, goose); skin and fat of [place] and chicken; processed meats; luncheon meats (salami, bologna); frankfurters and fast-food hamburger

## 2021-10-17 NOTE — Progress Notes (Signed)
Gastroenterology Pre-Procedure Review ? ?Request Date: 11/21/2021 ?Requesting Physician: Dr. Servando Snare ? ?PATIENT REVIEW QUESTIONS: The patient responded to the following health history questions as indicated:   ? ?1. Are you having any GI issues? no ?2. Do you have a personal history of Polyps? no ?3. Do you have a family history of Colon Cancer or Polyps? no ?4. Diabetes Mellitus? no ?5. Joint replacements in the past 12 months?no ?6. Major health problems in the past 3 months?no ?7. Any artificial heart valves, MVP, or defibrillator?no ?   ?MEDICATIONS & ALLERGIES:    ?Patient reports the following regarding taking any anticoagulation/antiplatelet therapy:   ?Plavix, Coumadin, Eliquis, Xarelto, Lovenox, Pradaxa, Brilinta, or Effient? no ?Aspirin? no ? ?Patient confirms/reports the following medications:  ?Current Outpatient Medications  ?Medication Sig Dispense Refill  ? calcium carbonate (OS-CAL) 600 MG TABS tablet Take 600 mg by mouth daily with breakfast.    ? hydrochlorothiazide (MICROZIDE) 12.5 MG capsule Take 1 capsule (12.5 mg total) by mouth daily. 90 capsule 1  ? levothyroxine (SYNTHROID) 75 MCG tablet Take 1 tablet (75 mcg total) by mouth daily before breakfast. 90 tablet 1  ? Multiple Vitamins-Minerals (MULTIVITAMIN WOMENS 50+ ADV PO) Take by mouth.    ? ?No current facility-administered medications for this visit.  ? ? ?Patient confirms/reports the following allergies:  ?Allergies  ?Allergen Reactions  ? Azithromycin Diarrhea  ? ? ?No orders of the defined types were placed in this encounter. ? ? ?AUTHORIZATION INFORMATION ?Primary Insurance: ?1D#: ?Group #: ? ?Secondary Insurance: ?1D#: ?Group #: ? ?SCHEDULE INFORMATION: ?Date: 11/21/2021 ?Time: ?Location: [facility] ? ?

## 2021-10-18 LAB — RENAL FUNCTION PANEL
Albumin: 4.6 g/dL (ref 3.8–4.9)
BUN/Creatinine Ratio: 15 (ref 9–23)
BUN: 15 mg/dL (ref 6–24)
CO2: 27 mmol/L (ref 20–29)
Calcium: 9.9 mg/dL (ref 8.7–10.2)
Chloride: 100 mmol/L (ref 96–106)
Creatinine, Ser: 0.97 mg/dL (ref 0.57–1.00)
Glucose: 86 mg/dL (ref 70–99)
Phosphorus: 2.9 mg/dL — ABNORMAL LOW (ref 3.0–4.3)
Potassium: 3.8 mmol/L (ref 3.5–5.2)
Sodium: 143 mmol/L (ref 134–144)
eGFR: 69 mL/min/{1.73_m2} (ref >59)

## 2021-10-18 LAB — TSH: TSH: 4.27 u[IU]/mL (ref 0.450–4.500)

## 2021-10-18 LAB — LIPID PANEL WITH LDL/HDL RATIO
Cholesterol, Total: 235 mg/dL — ABNORMAL HIGH (ref 100–199)
HDL: 55 mg/dL (ref >39)
LDL Chol Calc (NIH): 157 mg/dL — ABNORMAL HIGH (ref 0–99)
LDL/HDL Ratio: 2.9 ratio (ref 0.0–3.2)
Triglycerides: 130 mg/dL (ref 0–149)
VLDL Cholesterol Cal: 23 mg/dL (ref 5–40)

## 2021-10-19 ENCOUNTER — Ambulatory Visit: Payer: BLUE CROSS/BLUE SHIELD | Admitting: Family Medicine

## 2021-11-09 ENCOUNTER — Other Ambulatory Visit: Payer: Self-pay

## 2021-11-13 ENCOUNTER — Ambulatory Visit: Payer: BLUE CROSS/BLUE SHIELD | Admitting: Family Medicine

## 2021-11-20 ENCOUNTER — Encounter: Payer: Self-pay | Admitting: Gastroenterology

## 2021-11-21 ENCOUNTER — Ambulatory Visit: Payer: BLUE CROSS/BLUE SHIELD | Admitting: Anesthesiology

## 2021-11-21 ENCOUNTER — Ambulatory Visit
Admission: RE | Admit: 2021-11-21 | Discharge: 2021-11-21 | Disposition: A | Discharge status: Home / Self Care | Payer: BLUE CROSS/BLUE SHIELD | Attending: Gastroenterology | Admitting: Gastroenterology

## 2021-11-21 ENCOUNTER — Encounter: Payer: Self-pay | Admitting: Gastroenterology

## 2021-11-21 ENCOUNTER — Encounter
Admission: RE | Disposition: A | Discharge status: Home / Self Care | Payer: Self-pay | Source: Home / Self Care | Attending: Gastroenterology

## 2021-11-21 DIAGNOSIS — I10 Essential (primary) hypertension: Secondary | ICD-10-CM | POA: Insufficient documentation

## 2021-11-21 DIAGNOSIS — Z1211 Encounter for screening for malignant neoplasm of colon: Secondary | ICD-10-CM | POA: Diagnosis not present

## 2021-11-21 DIAGNOSIS — E039 Hypothyroidism, unspecified: Secondary | ICD-10-CM | POA: Diagnosis not present

## 2021-11-21 HISTORY — PX: COLONOSCOPY WITH PROPOFOL: SHX5780

## 2021-11-21 LAB — POCT PREGNANCY, URINE: Preg Test, Ur: NEGATIVE

## 2021-11-21 LAB — HM COLONOSCOPY

## 2021-11-21 SURGERY — COLONOSCOPY WITH PROPOFOL
Anesthesia: General

## 2021-11-21 MED ORDER — LIDOCAINE HCL (CARDIAC) PF 100 MG/5ML IV SOSY
PREFILLED_SYRINGE | INTRAVENOUS | Status: DC | PRN
Start: 2021-11-21 — End: 2021-11-21
  Administered 2021-11-21: 100 mg via INTRAVENOUS

## 2021-11-21 MED ORDER — SODIUM CHLORIDE 0.9 % IV SOLN: INTRAVENOUS | Status: DC

## 2021-11-21 MED ORDER — PROPOFOL 500 MG/50ML IV EMUL
INTRAVENOUS | Status: DC | PRN
  Administered 2021-11-21: 155 ug/kg/min via INTRAVENOUS

## 2021-11-21 MED ORDER — PROPOFOL 10 MG/ML IV BOLUS
INTRAVENOUS | Status: DC | PRN
  Administered 2021-11-21: 10 mg via INTRAVENOUS
  Administered 2021-11-21: 20 mg via INTRAVENOUS
  Administered 2021-11-21: 70 mg via INTRAVENOUS

## 2021-11-21 NOTE — Anesthesia Preprocedure Evaluation (Signed)
Anesthesia Evaluation  ?Patient identified by MRN, date of birth, ID band ?Patient awake ? ? ? ?Reviewed: ?Allergy & Precautions, H&P , NPO status , Patient's Chart, lab work & pertinent test results, reviewed documented beta blocker date and time  ? ?Airway ?Mallampati: II ? ? ?Neck ROM: full ? ? ? Dental ? ?(+) Poor Dentition ?  ?Pulmonary ?neg pulmonary ROS,  ?  ?Pulmonary exam normal ? ? ? ? ? ? ? Cardiovascular ?hypertension, On Medications ?negative cardio ROS ?Normal cardiovascular exam ?Rhythm:regular Rate:Normal ? ? ?  ?Neuro/Psych ?negative neurological ROS ? negative psych ROS  ? GI/Hepatic ?negative GI ROS, Neg liver ROS,   ?Endo/Other  ?Hypothyroidism  ? Renal/GU ?negative Renal ROS  ?negative genitourinary ?  ?Musculoskeletal ? ? Abdominal ?  ?Peds ? Hematology ?negative hematology ROS ?(+)   ?Anesthesia Other Findings ?Past Medical History: ?No date: History of miscarriage ?No date: Hypertension ?No date: Hypothyroidism ?No date: Vitamin D deficiency ?Past Surgical History: ?No date: arthroscopy of knee ?No date: BACK SURGERY ?    Comment:  ruptured disc in L5-S1 ?03/2002: CESAREAN SECTION ?BMI   ? Body Mass Index: 30.04 kg/m?  ?  ? Reproductive/Obstetrics ?negative OB ROS ? ?  ? ? ? ? ? ? ? ? ? ? ? ? ? ?  ?  ? ? ? ? ? ? ? ? ?Anesthesia Physical ?Anesthesia Plan ? ?ASA: 2 ? ?Anesthesia Plan: General  ? ?Post-op Pain Management:   ? ?Induction:  ? ?PONV Risk Score and Plan:  ? ?Airway Management Planned:  ? ?Additional Equipment:  ? ?Intra-op Plan:  ? ?Post-operative Plan:  ? ?Informed Consent: I have reviewed the patients History and Physical, chart, labs and discussed the procedure including the risks, benefits and alternatives for the proposed anesthesia with the patient or authorized representative who has indicated his/her understanding and acceptance.  ? ? ? ?Dental Advisory Given ? ?Plan Discussed with: CRNA ? ?Anesthesia Plan Comments:   ? ? ? ? ? ? ?Anesthesia  Quick Evaluation ? ?

## 2021-11-21 NOTE — Op Note (Signed)
[facility] ?Gastroenterology ?Patient Name: Stacey Casey ?Procedure Date: 11/21/2021 11:33 AM ?MRN: [id number] ?Account #: [id number] ?Date of Birth: 1966-02-29 ?Admit Type: Outpatient ?Age: 56 ?Room: [facility] ENDO ROOM 4 ?Gender: Female ?Note Status: Finalized ?Instrument Name: Colonoscope [id number] ?Procedure:             Colonoscopy ?Indications:           Screening for colorectal malignant neoplasm ?Providers:             Midge Minium MD, MD ?Referring MD:          Duanne Limerick, MD (Referring MD) ?Medicines:             Propofol per Anesthesia ?Complications:         No immediate complications. ?Procedure:             Pre-Anesthesia Assessment: ?                       - Prior to the procedure, a History and Physical was  ?                       performed, and patient medications and allergies were  ?                       reviewed. The patient's tolerance of previous  ?                       anesthesia was also reviewed. The risks and benefits  ?                       of the procedure and the sedation options and risks  ?                       were discussed with the patient. All questions were  ?                       answered, and informed consent was obtained. Prior  ?                       Anticoagulants: The patient has taken no previous  ?                       anticoagulant or antiplatelet agents. ASA Grade  ?                       Assessment: II - A patient with mild systemic disease.  ?                       After reviewing the risks and benefits, the patient  ?                       was deemed in satisfactory condition to undergo the  ?                       procedure. ?                       After obtaining informed consent, the colonoscope was  ?  passed under direct vision. Throughout the procedure,  ?                       the patient's blood pressure, pulse, and oxygen  ?                       saturations were monitored continuously. The  ?                        Colonoscope was introduced through the anus and  ?                       advanced to the the cecum, identified by appendiceal  ?                       orifice and ileocecal valve. The colonoscopy was  ?                       performed without difficulty. The patient tolerated  ?                       the procedure well. The quality of the bowel  ?                       preparation was excellent. ?Findings: ?     The perianal and digital rectal examinations were normal. ?     The colon (entire examined portion) appeared normal. ?Impression:            - The entire examined colon is normal. ?                       - No specimens collected. ?Recommendation:        - Discharge patient to home. ?                       - Resume previous diet. ?                       - Continue present medications. ?                       - Repeat colonoscopy in 10 years for screening  ?                       purposes. ?Procedure Code(s):     --- Professional --- ?                       [phone], Colonoscopy, flexible; diagnostic, including  ?                       collection of specimen(s) by brushing or washing, when  ?                       performed (separate procedure) ?Diagnosis Code(s):     --- Professional --- ?                       Z12.11, Encounter for screening for malignant neoplasm  ?  of colon ?CPT copyright 2019 American Medical Association. All rights reserved. ?The codes documented in this report are preliminary and upon coder review may  ?be revised to meet current compliance requirements. ?Midge Minium MD, MD ?11/21/2021 11:52:01 AM ?This report has been signed electronically. ?Number of Addenda: 0 ?Note Initiated On: 11/21/2021 11:33 AM ?Scope Withdrawal Time: 0 hours 6 minutes 27 seconds  ?Total Procedure Duration: 0 hours 12 minutes 19 seconds  ?Estimated Blood Loss:  Estimated blood loss: none. ?     [facility] ?

## 2021-11-21 NOTE — Anesthesia Procedure Notes (Signed)
Procedure Name: General with mask airway ?Date/Time: 11/21/2021 11:48 AM ?Performed by: Mohammed Kindle, CRNA ?Pre-anesthesia Checklist: Patient identified, Emergency Drugs available, Suction available and Patient being monitored ?Patient Re-evaluated:Patient Re-evaluated prior to induction ?Oxygen Delivery Method: Simple face mask ?Induction Type: IV induction ?Placement Confirmation: positive ETCO2 and CO2 detector ?Dental Injury: Teeth and Oropharynx as per pre-operative assessment  ? ? ? ? ?

## 2021-11-21 NOTE — Anesthesia Postprocedure Evaluation (Signed)
Anesthesia Post Note ? ?Patient: GAVIN FAIVRE ? ?Procedure(s) Performed: COLONOSCOPY WITH PROPOFOL ? ?Patient location during evaluation: PACU ?Anesthesia Type: General ?Level of consciousness: awake and alert, oriented and patient cooperative ?Pain management: pain level controlled ?Vital Signs Assessment: post-procedure vital signs reviewed and stable ?Respiratory status: spontaneous breathing, nonlabored ventilation and respiratory function stable ?Cardiovascular status: blood pressure returned to baseline and stable ?Postop Assessment: adequate PO intake ?Anesthetic complications: no ? ? ?No notable events documented. ? ? ?Last Vitals:  ?Vitals:  ? 11/21/21 1157 11/21/21 1207  ?BP: 121/74 115/86  ?Pulse: 89 78  ?Resp:  15  ?Temp: (!) 36.4 ?C   ?SpO2: 100% 99%  ?  ?Last Pain:  ?Vitals:  ? 11/21/21 1207  ?TempSrc:   ?PainSc: 0-No pain  ? ? ?  ?  ?  ?  ?  ?  ? ?Reed Breech ? ? ? ? ?

## 2021-11-21 NOTE — Transfer of Care (Signed)
Immediate Anesthesia Transfer of Care Note ? ?Patient: Stacey Casey ? ?Procedure(s) Performed: COLONOSCOPY WITH PROPOFOL ? ?Patient Location: Endoscopy Unit ? ?Anesthesia Type:General ? ?Level of Consciousness: awake, drowsy and patient cooperative ? ?Airway & Oxygen Therapy: Patient Spontanous Breathing and Patient connected to face mask oxygen ? ?Post-op Assessment: Report given to RN and Post -op Vital signs reviewed and stable ? ?Post vital signs: Reviewed and stable ? ?Last Vitals:  ?Vitals Value Taken Time  ?BP 121/74 11/21/21 1153  ?Temp    ?Pulse 75 11/21/21 1154  ?Resp 15 11/21/21 1154  ?SpO2 100 % 11/21/21 1154  ?Vitals shown include unvalidated device data. ? ?Last Pain:  ?Vitals:  ? 11/21/21 1005  ?TempSrc: Temporal  ?PainSc: 0-No pain  ?   ? ?  ? ?Complications: No notable events documented. ?

## 2021-11-21 NOTE — H&P (Signed)
? ?  Lucilla Lame, MD [facility] ?[address] ?[place], [place] [address] ?Phone: [phone] ?Fax : [phone] ? ?Primary Care Physician:  Juline Patch, MD ?Primary Gastroenterologist:  Dr. Allen Norris ? ?Pre-Procedure History & Physical: ?HPI:  Stacey Casey is a 56 y.o. female is here for a screening colonoscopy.  ? ?Past Medical History:  ?Diagnosis Date  ? History of miscarriage   ? Hypertension   ? Hypothyroidism   ? Vitamin D deficiency   ? ? ?Past Surgical History:  ?Procedure Laterality Date  ? arthroscopy of knee    ? BACK SURGERY    ? ruptured disc in L5-S1  ? CESAREAN SECTION  03/2002  ? ? ?Prior to Admission medications   ?Medication Sig Start Date End Date Taking? Authorizing Provider  ?calcium carbonate (OS-CAL) 600 MG TABS tablet Take 600 mg by mouth daily with breakfast.   Yes [provider]  ?hydrochlorothiazide (MICROZIDE) 12.5 MG capsule Take 1 capsule (12.5 mg total) by mouth daily. 10/17/21  Yes Juline Patch, MD  ?levothyroxine (SYNTHROID) 75 MCG tablet Take 1 tablet (75 mcg total) by mouth daily before breakfast. 10/17/21  Yes Juline Patch, MD  ?Multiple Vitamins-Minerals (MULTIVITAMIN WOMENS 50+ ADV PO) Take by mouth.   Yes [provider]  ? ? ?Allergies as of 10/17/2021 - Review Complete 10/17/2021  ?Allergen Reaction Noted  ? Azithromycin Diarrhea 03/06/2021  ? ? ?Family History  ?Problem Relation Age of Onset  ? Cancer Father   ? Prostate cancer Father   ? ? ?Social History  ? ?Socioeconomic History  ? Marital status: Married  ?  Spouse name: Not on file  ? Number of children: Not on file  ? Years of education: Not on file  ? Highest education level: Not on file  ?Occupational History  ? Not on file  ?Tobacco Use  ? Smoking status: Never  ? Smokeless tobacco: Never  ?Vaping Use  ? Vaping Use: Never used  ?Substance and Sexual Activity  ? Alcohol use: Yes  ?  Comment: ocassionally  ? Drug use: Not Currently  ? Sexual activity: Yes  ?Other Topics Concern  ? Not  on file  ?Social History Narrative  ? Not on file  ? ?Social Determinants of Health  ? ?Financial Resource Strain: Not on file  ?Food Insecurity: Not on file  ?Transportation Needs: Not on file  ?Physical Activity: Not on file  ?Stress: Not on file  ?Social Connections: Not on file  ?Intimate Partner Violence: Not on file  ? ? ?Review of Systems: ?See HPI, otherwise negative ROS ? ?Physical Exam: ?BP (!) 145/90   Pulse 70   Temp 97.7 ?F (36.5 ?C) (Temporal)   Resp 16   Ht 5\' 4"  (1.626 m)   Wt 79.4 kg   LMP 02/06/2021   SpO2 100%   BMI 30.04 kg/m?  ?General:   Alert,  pleasant and cooperative in NAD ?Head:  Normocephalic and atraumatic. ?Neck:  Supple; no masses or thyromegaly. ?Lungs:  Clear throughout to auscultation.    ?Heart:  Regular rate and rhythm. ?Abdomen:  Soft, nontender and nondistended. Normal bowel sounds, without guarding, and without rebound.   ?Neurologic:  Alert and  oriented x4;  grossly normal neurologically. ? ?Impression/Plan: ?[name] is now here to undergo a screening colonoscopy. ? ?Risks, benefits, and alternatives regarding colonoscopy have been reviewed with the patient.  Questions have been answered.  All parties agreeable. ?

## 2021-11-22 ENCOUNTER — Encounter: Payer: Self-pay | Admitting: Gastroenterology

## 2021-11-28 ENCOUNTER — Other Ambulatory Visit: Payer: Self-pay | Admitting: Family Medicine

## 2021-11-28 DIAGNOSIS — Z1231 Encounter for screening mammogram for malignant neoplasm of breast: Secondary | ICD-10-CM

## 2021-12-05 ENCOUNTER — Ambulatory Visit
Admission: RE | Admit: 2021-12-05 | Discharge: 2021-12-05 | Disposition: A | Discharge status: Home / Self Care | Payer: BLUE CROSS/BLUE SHIELD | Source: Ambulatory Visit | Attending: Family Medicine | Admitting: Family Medicine

## 2021-12-05 DIAGNOSIS — Z1231 Encounter for screening mammogram for malignant neoplasm of breast: Secondary | ICD-10-CM | POA: Diagnosis not present

## 2021-12-14 ENCOUNTER — Telehealth: Payer: Self-pay

## 2021-12-14 NOTE — Telephone Encounter (Signed)
Continuation of previous message- she should call [facility] and ask if they have heard from the films. I offered phone number, but pt stated she is at work and will check later. ?

## 2021-12-14 NOTE — Telephone Encounter (Signed)
Called pt and spoke to her concerning mammo report. We have not received the results on her May 2 mammo- pt stated she did go, but they told her they had to get her previous mammo report/ films before giving results on this one. I advised pt she should call [facility] and ask  ?

## 2021-12-20 ENCOUNTER — Other Ambulatory Visit: Payer: Self-pay | Admitting: Family Medicine

## 2021-12-20 ENCOUNTER — Inpatient Hospital Stay
Admission: RE | Admit: 2021-12-20 | Discharge: 2021-12-20 | Disposition: A | Discharge status: Home / Self Care | Payer: Self-pay | Source: Ambulatory Visit | Attending: *Deleted | Admitting: *Deleted

## 2021-12-20 ENCOUNTER — Other Ambulatory Visit: Payer: Self-pay | Admitting: Nurse Practitioner

## 2021-12-20 ENCOUNTER — Other Ambulatory Visit: Payer: Self-pay | Admitting: *Deleted

## 2021-12-20 DIAGNOSIS — Z1231 Encounter for screening mammogram for malignant neoplasm of breast: Secondary | ICD-10-CM

## 2021-12-20 DIAGNOSIS — N6489 Other specified disorders of breast: Secondary | ICD-10-CM

## 2021-12-20 DIAGNOSIS — R928 Other abnormal and inconclusive findings on diagnostic imaging of breast: Secondary | ICD-10-CM

## 2022-01-12 ENCOUNTER — Ambulatory Visit
Admission: RE | Admit: 2022-01-12 | Discharge: 2022-01-12 | Disposition: A | Discharge status: Home / Self Care | Payer: BLUE CROSS/BLUE SHIELD | Source: Ambulatory Visit | Attending: Family Medicine | Admitting: Family Medicine

## 2022-01-12 DIAGNOSIS — N6489 Other specified disorders of breast: Secondary | ICD-10-CM

## 2022-01-12 DIAGNOSIS — R928 Other abnormal and inconclusive findings on diagnostic imaging of breast: Secondary | ICD-10-CM

## 2022-01-12 DIAGNOSIS — R922 Inconclusive mammogram: Secondary | ICD-10-CM | POA: Diagnosis not present

## 2022-04-19 ENCOUNTER — Ambulatory Visit: Payer: BLUE CROSS/BLUE SHIELD | Admitting: Family Medicine

## 2022-05-08 ENCOUNTER — Encounter: Payer: Self-pay | Admitting: Family Medicine

## 2022-05-08 ENCOUNTER — Ambulatory Visit (INDEPENDENT_AMBULATORY_CARE_PROVIDER_SITE_OTHER): Payer: BLUE CROSS/BLUE SHIELD | Admitting: Family Medicine

## 2022-05-08 VITALS — BP 124/80 | HR 80 | Ht 64.0 in | Wt 176.0 lb

## 2022-05-08 DIAGNOSIS — E034 Atrophy of thyroid (acquired): Secondary | ICD-10-CM | POA: Diagnosis not present

## 2022-05-08 DIAGNOSIS — Z23 Encounter for immunization: Secondary | ICD-10-CM

## 2022-05-08 DIAGNOSIS — I10 Essential (primary) hypertension: Secondary | ICD-10-CM | POA: Diagnosis not present

## 2022-05-08 MED ORDER — HYDROCHLOROTHIAZIDE 12.5 MG PO CAPS: 12.5000 mg | ORAL_CAPSULE | Freq: Every day | ORAL | 1 refills | Status: DC

## 2022-05-08 MED ORDER — LEVOTHYROXINE SODIUM 75 MCG PO TABS: 75.0000 ug | ORAL_TABLET | Freq: Every day | ORAL | 1 refills | Status: DC

## 2022-05-08 NOTE — Progress Notes (Signed)
Date:  05/08/2022   Name:  Stacey Casey   DOB:  Dec 16, 1965   MRN:  [id number]   Chief Complaint: Hypertension, Hypothyroidism, and Flu Vaccine  Hypertension This is a chronic problem. The current episode started more than 1 year ago. The problem has been gradually improving since onset. The problem is controlled. Pertinent negatives include no blurred vision, chest pain, headaches, neck pain, orthopnea, palpitations, peripheral edema, PND, shortness of breath or sweats. There are no associated agents to hypertension. Past treatments include diuretics. The current treatment provides moderate improvement. There are no compliance problems.  There is no history of angina, kidney disease, CAD/MI, CVA, heart failure, left ventricular hypertrophy, PVD or retinopathy. Identifiable causes of hypertension include a thyroid problem. There is no history of chronic renal disease, a hypertension causing med or renovascular disease.  Thyroid Problem Presents for follow-up visit. Patient reports no anxiety, cold intolerance, constipation, depressed mood, diaphoresis, diarrhea, dry skin, fatigue, hair loss, heat intolerance, menstrual problem, nail problem, palpitations, weight gain or weight loss. The symptoms have been stable. There is no history of heart failure.    Lab Results  Component Value Date   NA 143 10/17/2021   K 3.8 10/17/2021   CO2 27 10/17/2021   GLUCOSE 86 10/17/2021   BUN 15 10/17/2021   CREATININE 0.97 10/17/2021   CALCIUM 9.9 10/17/2021   EGFR 69 10/17/2021   GFRNONAA 79 01/27/2020   Lab Results  Component Value Date   CHOL 235 (H) 10/17/2021   HDL 55 10/17/2021   LDLCALC 157 (H) 10/17/2021   TRIG 130 10/17/2021   Lab Results  Component Value Date   TSH 4.270 10/17/2021   No results found for: "HGBA1C" Lab Results  Component Value Date   WBC 7.2 01/27/2020   HGB 11.7 (A) 01/27/2020   HCT 38 01/27/2020   PLT 214 01/27/2020   Lab Results  Component Value Date    ALT 19 01/27/2020   AST 18 01/27/2020   ALKPHOS 69 01/27/2020   Lab Results  Component Value Date   VD25OH 29 01/27/2020     Review of Systems  Constitutional:  Negative for chills, diaphoresis, fatigue, fever, weight gain and weight loss.  HENT:  Negative for drooling, ear discharge, ear pain and sore throat.   Eyes:  Negative for blurred vision.  Respiratory:  Negative for cough, shortness of breath and wheezing.   Cardiovascular:  Negative for chest pain, palpitations, orthopnea, leg swelling and PND.  Gastrointestinal:  Negative for abdominal pain, blood in stool, constipation, diarrhea and nausea.  Endocrine: Negative for cold intolerance, heat intolerance and polydipsia.  Genitourinary:  Negative for dysuria, frequency, hematuria, menstrual problem and urgency.  Musculoskeletal:  Negative for back pain, myalgias and neck pain.  Skin:  Negative for rash.  Allergic/Immunologic: Negative for environmental allergies.  Neurological:  Negative for dizziness and headaches.  Hematological:  Does not bruise/bleed easily.  Psychiatric/Behavioral:  Negative for suicidal ideas. The patient is not nervous/anxious.     Patient Active Problem List   Diagnosis Date Noted   Colon cancer screening    Hypertension 03/06/2021   Hypothyroidism 03/06/2021   Vitamin D deficiency 03/06/2021   History of miscarriage 03/06/2021    Allergies  Allergen Reactions   Azithromycin Diarrhea    Past Surgical History:  Procedure Laterality Date   arthroscopy of knee     BACK SURGERY     ruptured disc in L5-S1   CESAREAN SECTION  03/2002   COLONOSCOPY  WITH PROPOFOL N/A 11/21/2021   Procedure: COLONOSCOPY WITH PROPOFOL;  Surgeon: Lucilla Lame, MD;  Location: [facility] ENDOSCOPY;  Service: Endoscopy;  Laterality: N/A;    Social History   Tobacco Use   Smoking status: Never   Smokeless tobacco: Never  Vaping Use   Vaping Use: Never used  Substance Use Topics   Alcohol use: Yes    Comment:  ocassionally   Drug use: Not Currently     Medication list has been reviewed and updated.  Current Meds  Medication Sig   calcium carbonate (OS-CAL) 600 MG TABS tablet Take 600 mg by mouth daily with breakfast.   hydrochlorothiazide (MICROZIDE) 12.5 MG capsule Take 1 capsule (12.5 mg total) by mouth daily.   levothyroxine (SYNTHROID) 75 MCG tablet Take 1 tablet (75 mcg total) by mouth daily before breakfast.   Multiple Vitamins-Minerals (MULTIVITAMIN WOMENS 50+ ADV PO) Take by mouth.       05/08/2022    4:06 PM 10/17/2021    9:40 AM 07/27/2021    4:17 PM 05/15/2021    3:18 PM  GAD 7 : Generalized Anxiety Score  Nervous, Anxious, on Edge 0 0 0 0  Control/stop worrying 0 0 0 0  Worry too much - different things 0 0 0 0  Trouble relaxing 0 0 0 0  Restless 0 0 0 0  Easily annoyed or irritable 0 0 0 0  Afraid - awful might happen 0 0 0 0  Total GAD 7 Score 0 0 0 0  Anxiety Difficulty Not difficult at all Not difficult at all Not difficult at all Not difficult at all       05/08/2022    4:06 PM 10/17/2021    9:40 AM 07/27/2021    4:17 PM  Depression screen PHQ 2/9  Decreased Interest 0 0 0  Down, Depressed, Hopeless 0 0 0  PHQ - 2 Score 0 0 0  Altered sleeping 0 0 0  Tired, decreased energy 0 0 1  Change in appetite 0 0 0  Feeling bad or failure about yourself  0 0 0  Trouble concentrating 0 0 0  Moving slowly or fidgety/restless 0 0 0  Suicidal thoughts 0 0 0  PHQ-9 Score 0 0 1  Difficult doing work/chores Not difficult at all Not difficult at all Not difficult at all    BP Readings from Last 3 Encounters:  05/08/22 124/80  11/21/21 115/86  10/17/21 136/80    Physical Exam Vitals and nursing note reviewed. Exam conducted with a chaperone present.  Constitutional:      General: She is not in acute distress.    Appearance: She is not diaphoretic.  HENT:     Head: Normocephalic and atraumatic.     Right Ear: External ear normal.     Left Ear: External ear  normal.     Nose: Nose normal.  Eyes:     General:        Right eye: No discharge.        Left eye: No discharge.     Conjunctiva/sclera: Conjunctivae normal.     Pupils: Pupils are equal, round, and reactive to light.  Neck:     Thyroid: No thyromegaly.     Vascular: No JVD.  Cardiovascular:     Rate and Rhythm: Normal rate and regular rhythm.     Heart sounds: Normal heart sounds. No murmur heard.    No friction rub. No gallop.  Pulmonary:     Effort: Pulmonary  effort is normal.     Breath sounds: Normal breath sounds.  Abdominal:     General: Bowel sounds are normal.     Palpations: Abdomen is soft. There is no mass.     Tenderness: There is no abdominal tenderness. There is no guarding.  Musculoskeletal:        General: Normal range of motion.     Cervical back: Normal range of motion and neck supple.  Lymphadenopathy:     Cervical: No cervical adenopathy.  Skin:    General: Skin is warm and dry.  Neurological:     Mental Status: She is alert.     Deep Tendon Reflexes: Reflexes are normal and symmetric.     Wt Readings from Last 3 Encounters:  05/08/22 176 lb (79.8 kg)  11/21/21 175 lb (79.4 kg)  10/17/21 180 lb (81.6 kg)    BP 124/80   Pulse 80   Ht 5' 4"  (1.626 m)   Wt 176 lb (79.8 kg)   LMP 02/06/2021   BMI 30.21 kg/m   Assessment and Plan:  1. Primary hypertension Chronic.  Controlled.  Stable.  Blood pressure is 124/80.  Asymptomatic.  Tolerating medication well.  Continue hydrochlorothiazide 12.5 mg once a day.  And will recheck in 6 months.  We will check renal function panel for electrolytes and GFR status. - hydrochlorothiazide (MICROZIDE) 12.5 MG capsule; Take 1 capsule (12.5 mg total) by mouth daily.  Dispense: 90 capsule; Refill: 1 - Lipid Panel With LDL/HDL Ratio - Renal Function Panel  2. Hypothyroidism due to acquired atrophy of thyroid Chronic.  Controlled.  Stable.  Pending TSH with thyroid panel we will likely continue with 75 mcg  Synthroid once a day. - levothyroxine (SYNTHROID) 75 MCG tablet; Take 1 tablet (75 mcg total) by mouth daily before breakfast.  Dispense: 90 tablet; Refill: 1 - Thyroid Panel With TSH  3. Need for immunization against influenza Discussed and administered. - Flu Vaccine QUAD 25moIM (Fluarix, Fluzone & Alfiuria Quad PF)    DOtilio Miu MD

## 2022-05-11 DIAGNOSIS — E034 Atrophy of thyroid (acquired): Secondary | ICD-10-CM | POA: Diagnosis not present

## 2022-05-11 DIAGNOSIS — I10 Essential (primary) hypertension: Secondary | ICD-10-CM | POA: Diagnosis not present

## 2022-05-12 LAB — LIPID PANEL WITH LDL/HDL RATIO
Cholesterol, Total: 209 mg/dL — ABNORMAL HIGH (ref 100–199)
HDL: 42 mg/dL
LDL Chol Calc (NIH): 145 mg/dL — ABNORMAL HIGH (ref 0–99)
LDL/HDL Ratio: 3.5 ratio — ABNORMAL HIGH (ref 0.0–3.2)
Triglycerides: 123 mg/dL (ref 0–149)
VLDL Cholesterol Cal: 22 mg/dL (ref 5–40)

## 2022-05-12 LAB — RENAL FUNCTION PANEL
Albumin: 4.6 g/dL (ref 3.8–4.9)
BUN/Creatinine Ratio: 25 — ABNORMAL HIGH (ref 9–23)
BUN: 23 mg/dL (ref 6–24)
CO2: 24 mmol/L (ref 20–29)
Calcium: 9.8 mg/dL (ref 8.7–10.2)
Chloride: 102 mmol/L (ref 96–106)
Creatinine, Ser: 0.91 mg/dL (ref 0.57–1.00)
Glucose: 85 mg/dL (ref 70–99)
Phosphorus: 3.6 mg/dL (ref 3.0–4.3)
Potassium: 3.8 mmol/L (ref 3.5–5.2)
Sodium: 142 mmol/L (ref 134–144)
eGFR: 75 mL/min/{1.73_m2} (ref >59)

## 2022-05-12 LAB — THYROID PANEL WITH TSH
Free Thyroxine Index: 1.7 (ref 1.2–4.9)
T3 Uptake Ratio: 26 % (ref 24–39)
T4, Total: 6.4 ug/dL (ref 4.5–12.0)
TSH: 7.12 u[IU]/mL — ABNORMAL HIGH (ref 0.450–4.500)

## 2022-05-14 ENCOUNTER — Other Ambulatory Visit: Payer: Self-pay

## 2022-05-14 DIAGNOSIS — E034 Atrophy of thyroid (acquired): Secondary | ICD-10-CM

## 2022-05-14 MED ORDER — LEVOTHYROXINE SODIUM 88 MCG PO TABS: 88.0000 ug | ORAL_TABLET | Freq: Every day | ORAL | 0 refills | Status: DC

## 2022-06-25 NOTE — Progress Notes (Unsigned)
ANNUAL PREVENTATIVE CARE GYNECOLOGY  ENCOUNTER NOTE  Subjective:       Stacey Casey is a 56 y.o. No obstetric history on file. female here for a routine annual gynecologic exam. The patient {is/is not/has never been:13135} sexually active. The patient {is/is not:13135} taking hormone replacement therapy. {post-men bleed:13152::"Patient denies post-menopausal vaginal bleeding."} The patient wears seatbelts: {yes/no:311178}. The patient participates in regular exercise: {yes/no/not asked:9010}. Has the patient ever been transfused or tattooed?: {yes/no/not asked:9010}. The patient reports that there {is/is not:9024} domestic violence in her life.  Current complaints: 1.  ***    Gynecologic History Patient's last menstrual period was 02/06/2021. Contraception: {method:5051} Last Pap: 05/06/2020. Results were: normal Last mammogram: 12/05/2021. Results were: normal Last Colonoscopy: 11/21/2021: Repeat in 10 years Last Dexa Scan: Never done   Obstetric History OB History  No obstetric history on file.    Past Medical History:  Diagnosis Date   History of miscarriage    Hypertension    Hypothyroidism    Vitamin D deficiency     Family History  Problem Relation Age of Onset   Cancer Father    Prostate cancer Father     Past Surgical History:  Procedure Laterality Date   arthroscopy of knee     BACK SURGERY     ruptured disc in L5-S1   CESAREAN SECTION  03/2002   COLONOSCOPY WITH PROPOFOL N/A 11/21/2021   Procedure: COLONOSCOPY WITH PROPOFOL;  Surgeon: Midge Minium, MD;  Location: [facility] ENDOSCOPY;  Service: Endoscopy;  Laterality: N/A;    Social History   Socioeconomic History   Marital status: Married    Spouse name: Not on file   Number of children: Not on file   Years of education: Not on file   Highest education level: Not on file  Occupational History   Not on file  Tobacco Use   Smoking status: Never   Smokeless tobacco: Never  Vaping Use   Vaping  Use: Never used  Substance and Sexual Activity   Alcohol use: Yes    Comment: ocassionally   Drug use: Not Currently   Sexual activity: Yes  Other Topics Concern   Not on file  Social History Narrative   Not on file   Social Determinants of Health   Financial Resource Strain: Not on file  Food Insecurity: Not on file  Transportation Needs: Not on file  Physical Activity: Not on file  Stress: Not on file  Social Connections: Not on file  Intimate Partner Violence: Not on file    Current Outpatient Medications on File Prior to Visit  Medication Sig Dispense Refill   calcium carbonate (OS-CAL) 600 MG TABS tablet Take 600 mg by mouth daily with breakfast.     hydrochlorothiazide (MICROZIDE) 12.5 MG capsule Take 1 capsule (12.5 mg total) by mouth daily. 90 capsule 1   levothyroxine (SYNTHROID) 88 MCG tablet Take 1 tablet (88 mcg total) by mouth daily. 90 tablet 0   Multiple Vitamins-Minerals (MULTIVITAMIN WOMENS 50+ ADV PO) Take by mouth.     No current facility-administered medications on file prior to visit.    Allergies  Allergen Reactions   Azithromycin Diarrhea      Review of Systems ROS Review of Systems - General ROS: negative for - chills, fatigue, fever, hot flashes, night sweats, weight gain or weight loss Psychological ROS: negative for - anxiety, decreased libido, depression, mood swings, physical abuse or sexual abuse Ophthalmic ROS: negative for - blurry vision, eye pain or loss of  vision ENT ROS: negative for - headaches, hearing change, visual changes or vocal changes Allergy and Immunology ROS: negative for - hives, itchy/watery eyes or seasonal allergies Hematological and Lymphatic ROS: negative for - bleeding problems, bruising, swollen lymph nodes or weight loss Endocrine ROS: negative for - galactorrhea, hair pattern changes, hot flashes, malaise/lethargy, mood swings, palpitations, polydipsia/polyuria, skin changes, temperature intolerance or unexpected  weight changes Breast ROS: negative for - new or changing breast lumps or nipple discharge Respiratory ROS: negative for - cough or shortness of breath Cardiovascular ROS: negative for - chest pain, irregular heartbeat, palpitations or shortness of breath Gastrointestinal ROS: no abdominal pain, change in bowel habits, or black or bloody stools Genito-Urinary ROS: no dysuria, trouble voiding, or hematuria Musculoskeletal ROS: negative for - joint pain or joint stiffness Neurological ROS: negative for - bowel and bladder control changes Dermatological ROS: negative for rash and skin lesion changes   Objective:   LMP 02/06/2021  CONSTITUTIONAL: Well-developed, well-nourished female in no acute distress.  PSYCHIATRIC: Normal mood and affect. Normal behavior. Normal judgment and thought content. NEUROLGIC: Alert and oriented to person, place, and time. Normal muscle tone coordination. No cranial nerve deficit noted. HENT:  Normocephalic, atraumatic, External right and left ear normal. Oropharynx is clear and moist EYES: Conjunctivae and EOM are normal. Pupils are equal, round, and reactive to light. No scleral icterus.  NECK: Normal range of motion, supple, no masses.  Normal thyroid.  SKIN: Skin is warm and dry. No rash noted. Not diaphoretic. No erythema. No pallor. CARDIOVASCULAR: Normal heart rate noted, regular rhythm, no murmur. RESPIRATORY: Clear to auscultation bilaterally. Effort and breath sounds normal, no problems with respiration noted. BREASTS: Symmetric in size. No masses, skin changes, nipple drainage, or lymphadenopathy. ABDOMEN: Soft, normal bowel sounds, no distention noted.  No tenderness, rebound or guarding.  BLADDER: Normal PELVIC:  Bladder {:311640}  Urethra: {:311719}  Vulva: {:311722}  Vagina: {:311643}  Cervix: {:311644}  Uterus: {:311718}  Adnexa: {:311645}  RV: {Blank multiple:19196::"External Exam NormaI","No Rectal Masses","Normal Sphincter tone"}   MUSCULOSKELETAL: Normal range of motion. No tenderness.  No cyanosis, clubbing, or edema.  2+ distal pulses. LYMPHATIC: No Axillary, Supraclavicular, or Inguinal Adenopathy.   Labs: Lab Results  Component Value Date   WBC 7.2 01/27/2020   HGB 11.7 (A) 01/27/2020   HCT 38 01/27/2020   PLT 214 01/27/2020    Lab Results  Component Value Date   CREATININE 0.91 05/11/2022   BUN 23 05/11/2022   NA 142 05/11/2022   K 3.8 05/11/2022   CL 102 05/11/2022   CO2 24 05/11/2022    Lab Results  Component Value Date   ALT 19 01/27/2020   AST 18 01/27/2020   ALKPHOS 69 01/27/2020    Lab Results  Component Value Date   CHOL 209 (H) 05/11/2022   HDL 42 05/11/2022   LDLCALC 145 (H) 05/11/2022   TRIG 123 05/11/2022    Lab Results  Component Value Date   TSH 7.120 (H) 05/11/2022    No results found for: "HGBA1C"   Assessment:   No diagnosis found.   Plan:  Pap: Not needed Mammogram:  UTD Colon Screening:   UTD Labs: {Blank multiple:19196::"Lipid 1","FBS","TSH","Hemoglobin A1C","Vit D Level""***"} Routine preventative health maintenance measures emphasized: Exercise/Diet/Weight control, Tobacco Warnings, Alcohol/Substance use risks, Stress Management, Peer Pressure Issues, and Safe Sex COVID Vaccination status: Return to Clinic - 1 Year   Hildred Laser, MD [facility]

## 2022-06-26 ENCOUNTER — Ambulatory Visit (INDEPENDENT_AMBULATORY_CARE_PROVIDER_SITE_OTHER): Payer: BLUE CROSS/BLUE SHIELD | Admitting: Obstetrics and Gynecology

## 2022-06-26 ENCOUNTER — Encounter: Payer: Self-pay | Admitting: Obstetrics and Gynecology

## 2022-06-26 VITALS — BP 128/72 | HR 86 | Ht 64.02 in | Wt 173.0 lb

## 2022-06-26 DIAGNOSIS — Z01419 Encounter for gynecological examination (general) (routine) without abnormal findings: Secondary | ICD-10-CM | POA: Diagnosis not present

## 2022-06-26 DIAGNOSIS — Z7689 Persons encountering health services in other specified circumstances: Secondary | ICD-10-CM

## 2022-06-26 DIAGNOSIS — E663 Overweight: Secondary | ICD-10-CM

## 2022-06-26 DIAGNOSIS — N951 Menopausal and female climacteric states: Secondary | ICD-10-CM | POA: Diagnosis not present

## 2022-06-26 DIAGNOSIS — E039 Hypothyroidism, unspecified: Secondary | ICD-10-CM | POA: Diagnosis not present

## 2022-06-26 DIAGNOSIS — Z6825 Body mass index (BMI) 25.0-25.9, adult: Secondary | ICD-10-CM

## 2022-09-04 ENCOUNTER — Other Ambulatory Visit: Payer: Self-pay | Admitting: Family Medicine

## 2022-09-04 DIAGNOSIS — E034 Atrophy of thyroid (acquired): Secondary | ICD-10-CM

## 2022-10-01 ENCOUNTER — Other Ambulatory Visit: Payer: Self-pay | Admitting: Family Medicine

## 2022-10-01 DIAGNOSIS — E034 Atrophy of thyroid (acquired): Secondary | ICD-10-CM

## 2022-11-07 IMAGING — MG MM DIGITAL DIAGNOSTIC UNILAT*L* W/ TOMO W/ CAD
4 series · 4 of 12 positions shown · non-contrast
Comparison: Previous exam(s).

CLINICAL DATA: 56-year-old female presenting as a recall from
screening for possible left breast asymmetry.

EXAM:
DIGITAL DIAGNOSTIC UNILATERAL LEFT MAMMOGRAM WITH TOMOSYNTHESIS AND
CAD
TECHNIQUE: Left digital diagnostic mammography and breast tomosynthesis was
performed. The images were evaluated with computer-aided detection.

[L ML synth-2D]
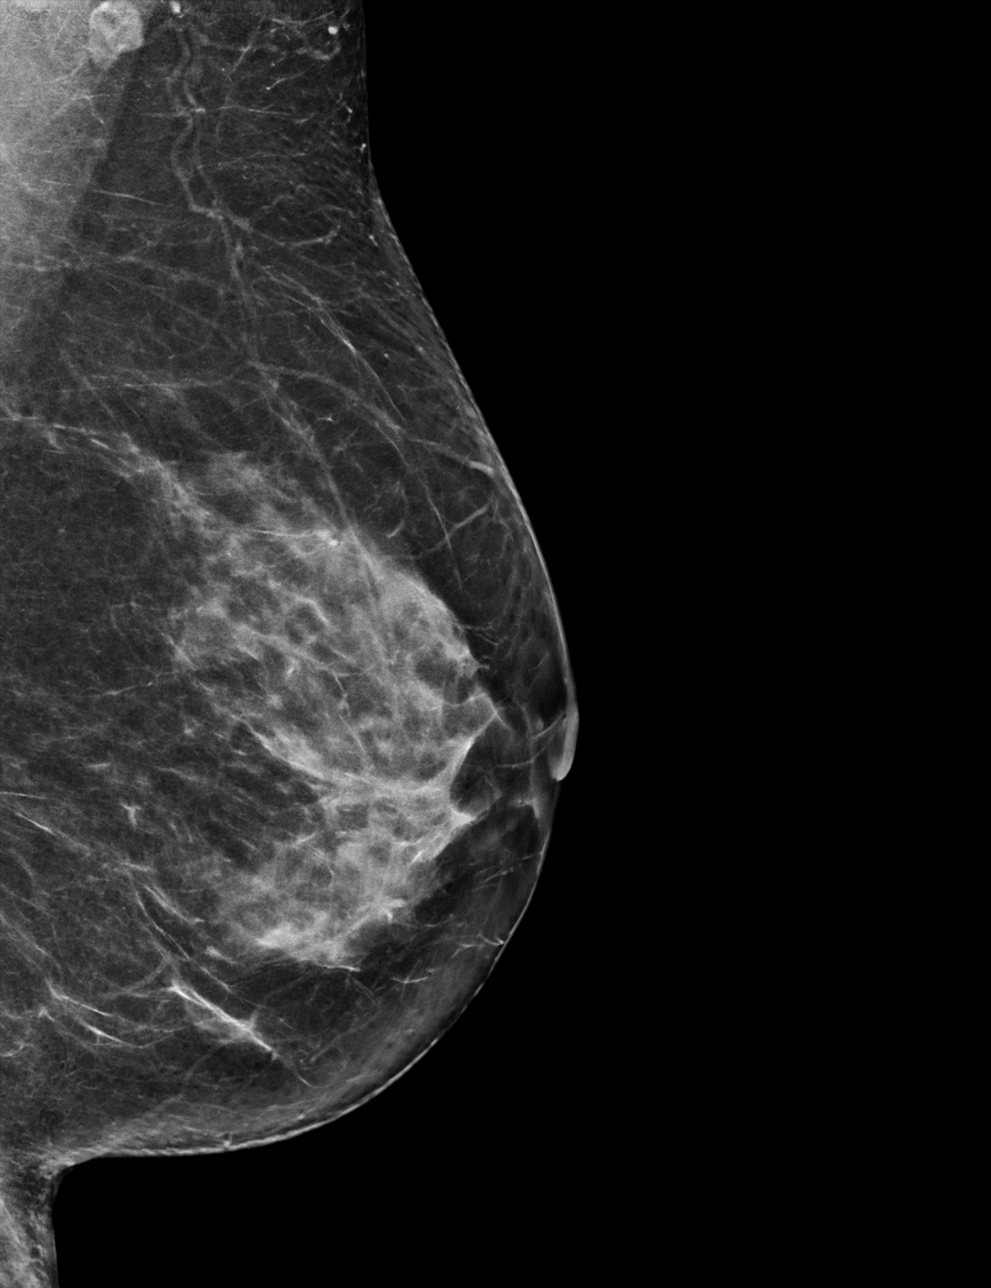

[L MLO synth-2D]
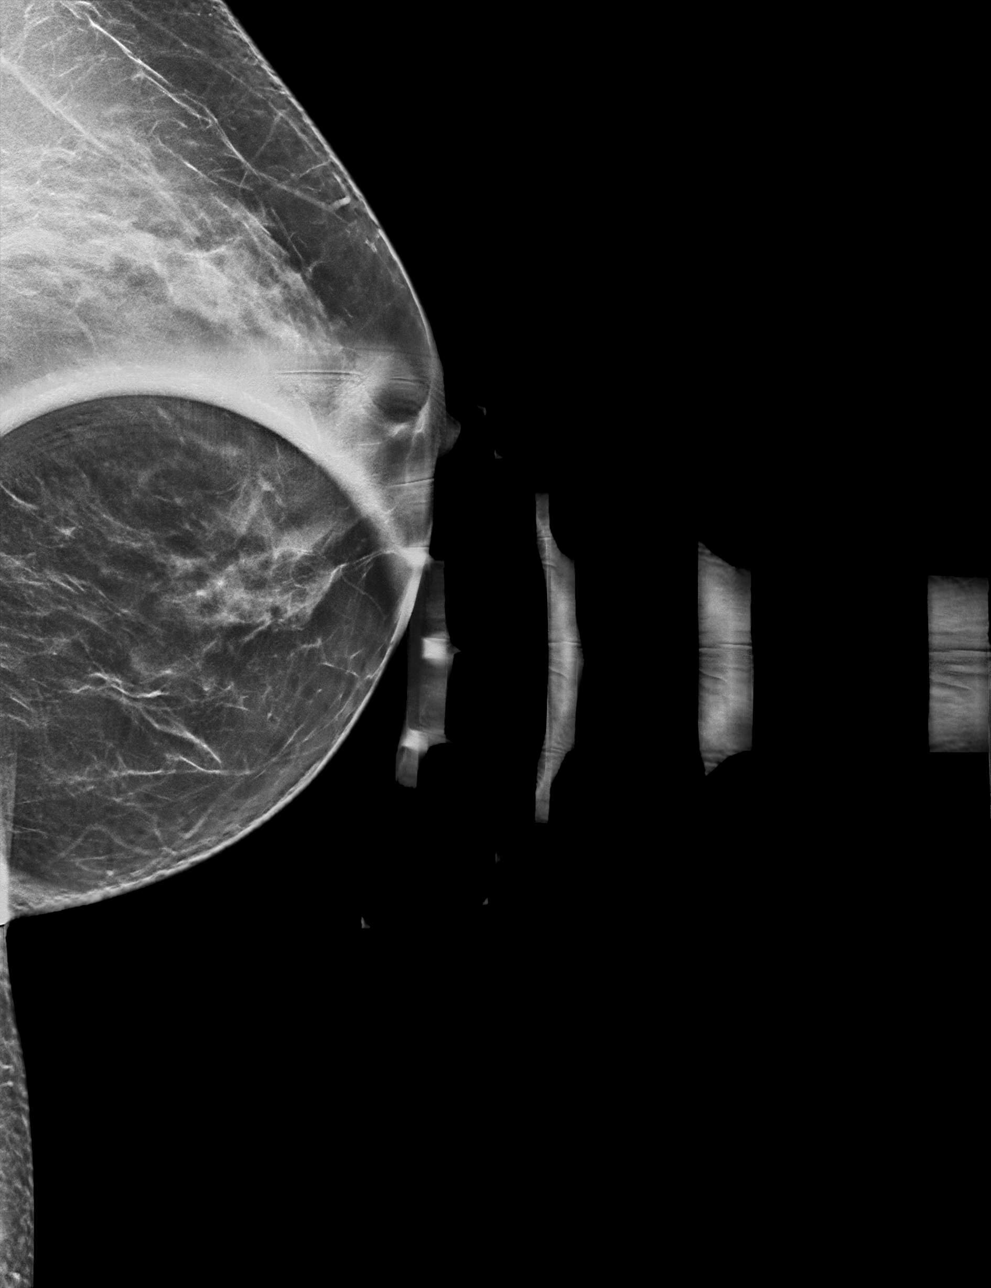

[L MLO tomo · tomo slice 32/63.0]
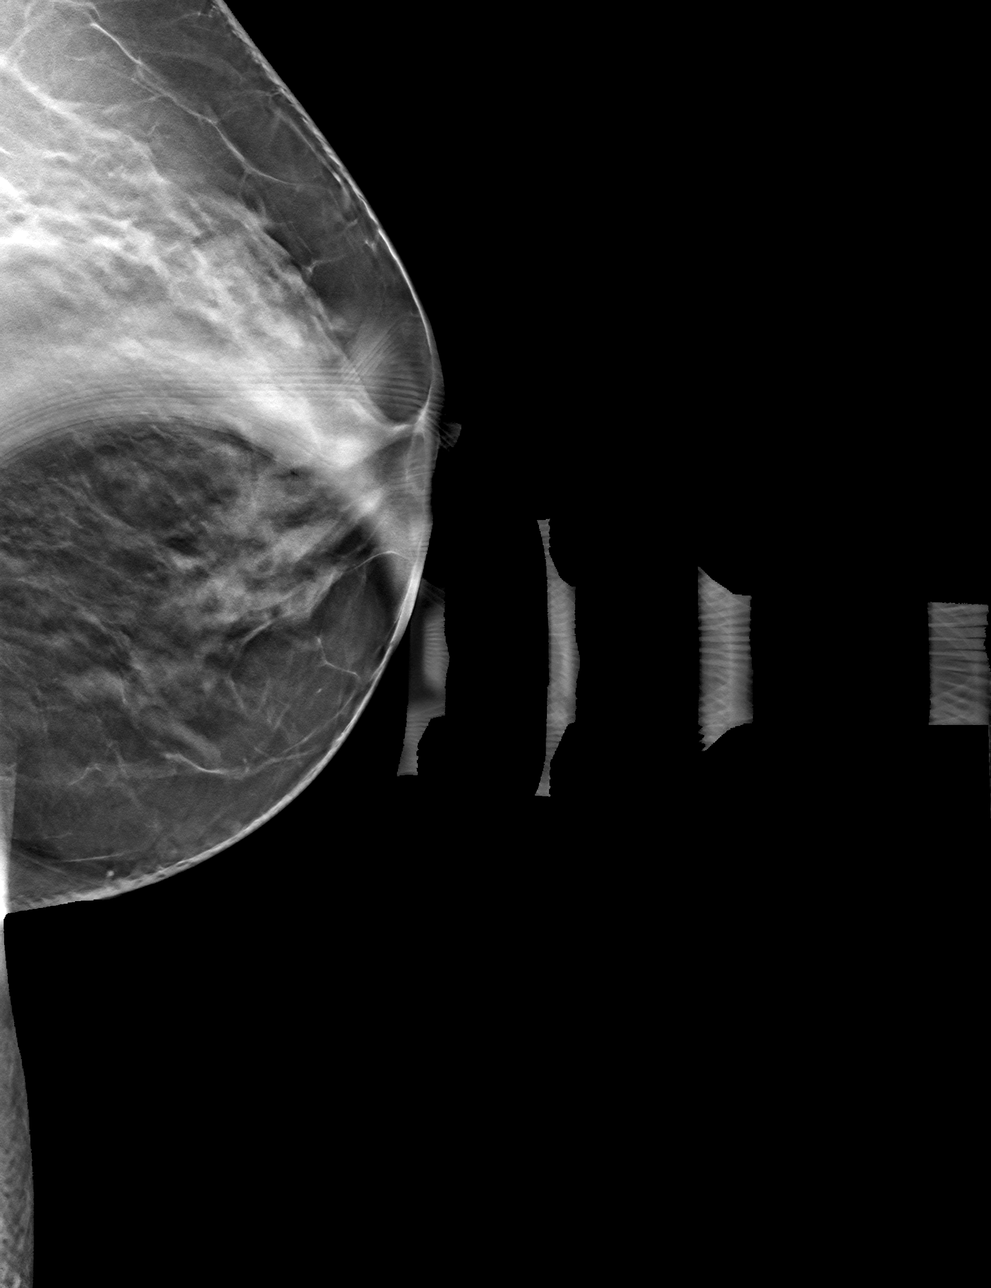

[L ML tomo · tomo slice 35/70.0]
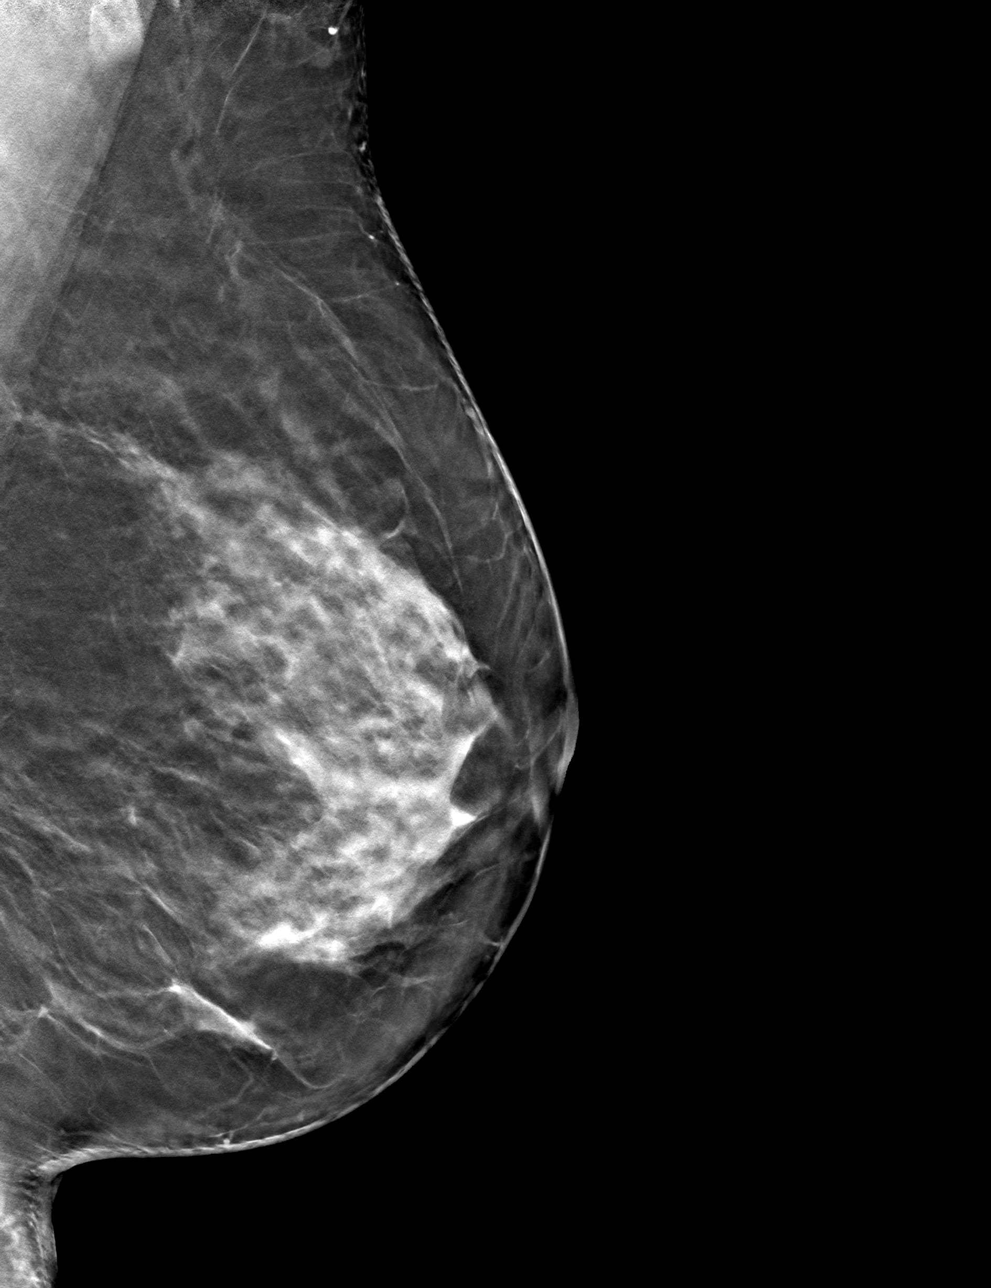

[4 of 12 positions shown; findings below may reference images not displayed]

ACR Breast Density Category c: The breast tissue is heterogeneously
dense, which may obscure small masses.
FINDINGS: Spot compression tomosynthesis MLO and full field mL tomosynthesis
views of the left breast performed for a questioned asymmetry seen
only on MLO view in the inferior left breast. On the additional
imaging the asymmetry resolves and most likely represented normal
fibroglandular tissue. There is no mass or distortion.
IMPRESSION: Resolution of the questioned asymmetry seen on recent screening
mammogram in the left breast. No mammographic evidence of
malignancy.

RECOMMENDATION:
Screening mammogram in one year.(Code:7I-E-N12)

I have discussed the findings and recommendations with the patient.
If applicable, a reminder letter will be sent to the patient
regarding the next appointment.

BI-RADS CATEGORY  1: Negative.

## 2022-11-09 ENCOUNTER — Other Ambulatory Visit: Payer: Self-pay

## 2022-11-09 DIAGNOSIS — E034 Atrophy of thyroid (acquired): Secondary | ICD-10-CM

## 2022-11-09 MED ORDER — LEVOTHYROXINE SODIUM 88 MCG PO TABS: 88.0000 ug | ORAL_TABLET | Freq: Every day | ORAL | 0 refills | Status: DC

## 2022-11-29 ENCOUNTER — Other Ambulatory Visit: Payer: Self-pay | Admitting: Family Medicine

## 2022-11-29 DIAGNOSIS — Z1231 Encounter for screening mammogram for malignant neoplasm of breast: Secondary | ICD-10-CM

## 2022-12-06 ENCOUNTER — Ambulatory Visit (INDEPENDENT_AMBULATORY_CARE_PROVIDER_SITE_OTHER): Payer: BLUE CROSS/BLUE SHIELD | Admitting: Family Medicine

## 2022-12-06 VITALS — BP 132/88 | HR 78 | Ht 64.0 in | Wt 172.0 lb

## 2022-12-06 DIAGNOSIS — E034 Atrophy of thyroid (acquired): Secondary | ICD-10-CM

## 2022-12-06 DIAGNOSIS — I10 Essential (primary) hypertension: Secondary | ICD-10-CM

## 2022-12-06 DIAGNOSIS — E7801 Familial hypercholesterolemia: Secondary | ICD-10-CM | POA: Diagnosis not present

## 2022-12-06 MED ORDER — HYDROCHLOROTHIAZIDE 12.5 MG PO CAPS: 12.5000 mg | ORAL_CAPSULE | Freq: Every day | ORAL | 1 refills | Status: DC

## 2022-12-06 MED ORDER — LISINOPRIL 5 MG PO TABS
5.0000 mg | ORAL_TABLET | Freq: Every day | ORAL | 3 refills | Status: DC
Start: 2022-12-06 — End: 2023-01-25

## 2022-12-06 MED ORDER — LEVOTHYROXINE SODIUM 88 MCG PO TABS: 88.0000 ug | ORAL_TABLET | Freq: Every day | ORAL | 1 refills | Status: DC

## 2022-12-06 NOTE — Progress Notes (Signed)
Date:  12/06/2022   Name:  Stacey Casey   DOB:  06-29-1966   MRN:  [id number]   Chief Complaint: Hypothyroidism and Hypertension  Hypertension This is a chronic problem. The current episode started more than 1 year ago. The problem has been gradually improving since onset. Pertinent negatives include no chest pain, headaches, palpitations or shortness of breath. Identifiable causes of hypertension include a thyroid problem.  Thyroid Problem Presents for follow-up visit. Patient reports no anxiety, constipation, diaphoresis, diarrhea, fatigue, hair loss, menstrual problem, palpitations, weight gain or weight loss. The symptoms have been stable.    Lab Results  Component Value Date   NA 142 05/11/2022   K 3.8 05/11/2022   CO2 24 05/11/2022   GLUCOSE 85 05/11/2022   BUN 23 05/11/2022   CREATININE 0.91 05/11/2022   CALCIUM 9.8 05/11/2022   EGFR 75 05/11/2022   GFRNONAA 79 01/27/2020   Lab Results  Component Value Date   CHOL 209 (H) 05/11/2022   HDL 42 05/11/2022   LDLCALC 145 (H) 05/11/2022   TRIG 123 05/11/2022   Lab Results  Component Value Date   TSH 7.120 (H) 05/11/2022   No results found for: "HGBA1C" Lab Results  Component Value Date   WBC 7.2 01/27/2020   HGB 11.7 (A) 01/27/2020   HCT 38 01/27/2020   PLT 214 01/27/2020   Lab Results  Component Value Date   ALT 19 01/27/2020   AST 18 01/27/2020   ALKPHOS 69 01/27/2020   Lab Results  Component Value Date   VD25OH 29 01/27/2020     Review of Systems  Constitutional: Negative.  Negative for chills, diaphoresis, fatigue, fever, unexpected weight change, weight gain and weight loss.  HENT:  Negative for congestion, ear discharge, ear pain, rhinorrhea, sinus pressure, sneezing and sore throat.   Respiratory:  Negative for cough, shortness of breath, wheezing and stridor.   Cardiovascular:  Negative for chest pain, palpitations and leg swelling.  Gastrointestinal:  Negative for abdominal pain, blood in  stool, constipation, diarrhea and nausea.  Genitourinary:  Negative for dysuria, flank pain, frequency, hematuria, menstrual problem, urgency and vaginal discharge.  Musculoskeletal:  Negative for arthralgias, back pain and myalgias.  Skin:  Negative for rash.  Neurological:  Negative for dizziness, weakness and headaches.  Hematological:  Negative for adenopathy. Does not bruise/bleed easily.  Psychiatric/Behavioral:  Negative for dysphoric mood. The patient is not nervous/anxious.     Patient Active Problem List   Diagnosis Date Noted   Colon cancer screening    Hypertension 03/06/2021   Hypothyroidism 03/06/2021   Vitamin D deficiency 03/06/2021   History of miscarriage 03/06/2021    Allergies  Allergen Reactions   Azithromycin Diarrhea    Past Surgical History:  Procedure Laterality Date   arthroscopy of knee     BACK SURGERY     ruptured disc in L5-S1   CESAREAN SECTION  03/2002   COLONOSCOPY WITH PROPOFOL N/A 11/21/2021   Procedure: COLONOSCOPY WITH PROPOFOL;  Surgeon: Midge Minium, MD;  Location: [facility] ENDOSCOPY;  Service: Endoscopy;  Laterality: N/A;    Social History   Tobacco Use   Smoking status: Never   Smokeless tobacco: Never  Vaping Use   Vaping Use: Never used  Substance Use Topics   Alcohol use: Yes    Comment: ocassionally   Drug use: Not Currently     Medication list has been reviewed and updated.  Current Meds  Medication Sig   calcium carbonate (OS-CAL) 600  MG TABS tablet Take 600 mg by mouth daily with breakfast.   hydrochlorothiazide (MICROZIDE) 12.5 MG capsule Take 1 capsule (12.5 mg total) by mouth daily.   levothyroxine (SYNTHROID) 88 MCG tablet Take 1 tablet (88 mcg total) by mouth daily.   Multiple Vitamins-Minerals (MULTIVITAMIN WOMENS 50+ ADV PO) Take by mouth.       12/06/2022    2:38 PM 12/06/2022    2:36 PM 05/08/2022    4:06 PM 10/17/2021    9:40 AM  GAD 7 : Generalized Anxiety Score  Nervous, Anxious, on Edge 0 0 0 0   Control/stop worrying 0 0 0 0  Worry too much - different things 0 0 0 0  Trouble relaxing 0 0 0 0  Restless 0 0 0 0  Easily annoyed or irritable 0 0 0 0  Afraid - awful might happen 0 0 0 0  Total GAD 7 Score 0 0 0 0  Anxiety Difficulty Not difficult at all Not difficult at all Not difficult at all Not difficult at all       12/06/2022    2:38 PM 12/06/2022    2:36 PM 05/08/2022    4:06 PM  Depression screen PHQ 2/9  Decreased Interest 0 0 0  Down, Depressed, Hopeless 0 0 0  PHQ - 2 Score 0 0 0  Altered sleeping 0 0 0  Tired, decreased energy 0 0 0  Change in appetite 0 0 0  Feeling bad or failure about yourself  0 0 0  Trouble concentrating 0 0 0  Moving slowly or fidgety/restless 0 0 0  Suicidal thoughts 0 0 0  PHQ-9 Score 0 0 0  Difficult doing work/chores Not difficult at all Not difficult at all Not difficult at all    BP Readings from Last 3 Encounters:  12/06/22 (!) 130/90  06/26/22 128/72  05/08/22 124/80    Physical Exam Vitals and nursing note reviewed. Exam conducted with a chaperone present.  Constitutional:      General: She is not in acute distress.    Appearance: She is not diaphoretic.  HENT:     Head: Normocephalic and atraumatic.     Right Ear: Tympanic membrane and external ear normal.     Left Ear: Tympanic membrane and external ear normal.     Nose: Nose normal.     Mouth/Throat:     Mouth: Mucous membranes are moist.  Eyes:     General:        Right eye: No discharge.        Left eye: No discharge.     Conjunctiva/sclera: Conjunctivae normal.     Pupils: Pupils are equal, round, and reactive to light.  Neck:     Thyroid: No thyromegaly.  Cardiovascular:     Rate and Rhythm: Normal rate and regular rhythm.     Heart sounds: Normal heart sounds. No murmur heard.    No friction rub. No gallop.  Pulmonary:     Effort: Pulmonary effort is normal.     Breath sounds: Normal breath sounds. No wheezing, rhonchi or rales.  Chest:     Chest  wall: No tenderness.  Abdominal:     General: Bowel sounds are normal.     Palpations: Abdomen is soft. There is no mass.     Tenderness: There is no abdominal tenderness. There is no guarding.  Musculoskeletal:        General: Normal range of motion.     Cervical back:  Neck supple.  Skin:    General: Skin is warm and dry.  Neurological:     Mental Status: She is alert.     Wt Readings from Last 3 Encounters:  12/06/22 172 lb (78 kg)  06/26/22 173 lb (78.5 kg)  05/08/22 176 lb (79.8 kg)    BP (!) 130/90 (BP Location: Right Arm, Cuff Size: Large)   Pulse 78   Ht 5\' 4"  (1.626 m)   Wt 172 lb (78 kg)   LMP 02/06/2021   SpO2 97%   BMI 29.52 kg/m   Assessment and Plan:  1. Primary hypertension Chronic.  Near controlled.  Stable.  Not at goal.  Patient still remains at the edge of control and we will continue hydrochlorothiazide at 12.5 mg but initiate lisinopril 5 mg once a day.  Will recheck patient in 6 to 8 weeks. - hydrochlorothiazide (MICROZIDE) 12.5 MG capsule; Take 1 capsule (12.5 mg total) by mouth daily.  Dispense: 90 capsule; Refill: 1 - Comprehensive Metabolic Panel (CMET) - lisinopril (ZESTRIL) 5 MG tablet; Take 1 tablet (5 mg total) by mouth daily.  Dispense: 90 tablet; Refill: 3  2. Hypothyroidism due to acquired atrophy of thyroid Chronic.  Controlled.  Stable.  Continue levothyroxine 88 mcg once a day if thyroid panel is in appropriate range. - levothyroxine (SYNTHROID) 88 MCG tablet; Take 1 tablet (88 mcg total) by mouth daily.  Dispense: 90 tablet; Refill: 1 - Thyroid Panel With TSH  3. Familial hypercholesterolemia Chronic.  Diet controlled.  Stable.  Will check lipid panel for current status of LDL control. - Lipid Panel With LDL/HDL Ratio    Elizabeth Sauer, MD

## 2022-12-07 LAB — THYROID PANEL WITH TSH
Free Thyroxine Index: 1.8 (ref 1.2–4.9)
T3 Uptake Ratio: 25 % (ref 24–39)
T4, Total: 7.3 ug/dL (ref 4.5–12.0)
TSH: 4.49 u[IU]/mL (ref 0.450–4.500)

## 2022-12-07 LAB — COMPREHENSIVE METABOLIC PANEL
ALT: 19 IU/L (ref 0–32)
AST: 19 IU/L (ref 0–40)
Albumin/Globulin Ratio: 1.5 (ref 1.2–2.2)
Albumin: 4.4 g/dL (ref 3.8–4.9)
Alkaline Phosphatase: 92 IU/L (ref 44–121)
BUN/Creatinine Ratio: 22 (ref 9–23)
BUN: 20 mg/dL (ref 6–24)
Bilirubin Total: 0.4 mg/dL (ref 0.0–1.2)
CO2: 25 mmol/L (ref 20–29)
Calcium: 10.1 mg/dL (ref 8.7–10.2)
Chloride: 103 mmol/L (ref 96–106)
Creatinine, Ser: 0.93 mg/dL (ref 0.57–1.00)
Globulin, Total: 3 g/dL (ref 1.5–4.5)
Glucose: 88 mg/dL (ref 70–99)
Potassium: 3.7 mmol/L (ref 3.5–5.2)
Sodium: 143 mmol/L (ref 134–144)
Total Protein: 7.4 g/dL (ref 6.0–8.5)
eGFR: 73 mL/min/{1.73_m2} (ref >59)

## 2022-12-07 LAB — LIPID PANEL WITH LDL/HDL RATIO
Cholesterol, Total: 236 mg/dL — ABNORMAL HIGH (ref 100–199)
HDL: 53 mg/dL (ref >39)
LDL Chol Calc (NIH): 163 mg/dL — ABNORMAL HIGH (ref 0–99)
LDL/HDL Ratio: 3.1 ratio (ref 0.0–3.2)
Triglycerides: 114 mg/dL (ref 0–149)
VLDL Cholesterol Cal: 20 mg/dL (ref 5–40)

## 2022-12-10 ENCOUNTER — Other Ambulatory Visit: Payer: Self-pay

## 2022-12-10 DIAGNOSIS — E7801 Familial hypercholesterolemia: Secondary | ICD-10-CM

## 2022-12-10 MED ORDER — ATORVASTATIN CALCIUM 10 MG PO TABS
10.0000 mg | ORAL_TABLET | Freq: Every day | ORAL | 1 refills | Status: DC
Start: 2022-12-10 — End: 2023-01-02

## 2023-01-01 ENCOUNTER — Other Ambulatory Visit: Payer: Self-pay | Admitting: Family Medicine

## 2023-01-01 DIAGNOSIS — E7801 Familial hypercholesterolemia: Secondary | ICD-10-CM

## 2023-01-16 ENCOUNTER — Ambulatory Visit
Admission: RE | Admit: 2023-01-16 | Discharge: 2023-01-16 | Disposition: A | Discharge status: Home / Self Care | Payer: BLUE CROSS/BLUE SHIELD | Source: Ambulatory Visit | Attending: Family Medicine | Admitting: Family Medicine

## 2023-01-16 DIAGNOSIS — Z1231 Encounter for screening mammogram for malignant neoplasm of breast: Secondary | ICD-10-CM | POA: Diagnosis not present

## 2023-01-22 ENCOUNTER — Other Ambulatory Visit: Payer: Self-pay

## 2023-01-22 DIAGNOSIS — E7801 Familial hypercholesterolemia: Secondary | ICD-10-CM

## 2023-01-22 DIAGNOSIS — R69 Illness, unspecified: Secondary | ICD-10-CM | POA: Diagnosis not present

## 2023-01-23 LAB — HEPATIC FUNCTION PANEL
ALT: 15 IU/L (ref 0–32)
AST: 16 IU/L (ref 0–40)
Albumin: 4.4 g/dL (ref 3.8–4.9)
Alkaline Phosphatase: 82 IU/L (ref 44–121)
Bilirubin Total: 0.4 mg/dL (ref 0.0–1.2)
Bilirubin, Direct: <0.1 mg/dL (ref 0.00–0.40)
Total Protein: 6.8 g/dL (ref 6.0–8.5)

## 2023-01-23 LAB — LIPID PANEL WITH LDL/HDL RATIO
Cholesterol, Total: 158 mg/dL (ref 100–199)
HDL: 50 mg/dL (ref >39)
LDL Chol Calc (NIH): 89 mg/dL (ref 0–99)
LDL/HDL Ratio: 1.8 ratio (ref 0.0–3.2)
Triglycerides: 103 mg/dL (ref 0–149)
VLDL Cholesterol Cal: 19 mg/dL (ref 5–40)

## 2023-01-25 ENCOUNTER — Ambulatory Visit (INDEPENDENT_AMBULATORY_CARE_PROVIDER_SITE_OTHER): Payer: BLUE CROSS/BLUE SHIELD | Admitting: Family Medicine

## 2023-01-25 ENCOUNTER — Encounter: Payer: Self-pay | Admitting: Family Medicine

## 2023-01-25 DIAGNOSIS — E034 Atrophy of thyroid (acquired): Secondary | ICD-10-CM

## 2023-01-25 DIAGNOSIS — E7801 Familial hypercholesterolemia: Secondary | ICD-10-CM | POA: Diagnosis not present

## 2023-01-25 DIAGNOSIS — I10 Essential (primary) hypertension: Secondary | ICD-10-CM | POA: Diagnosis not present

## 2023-01-25 MED ORDER — HYDROCHLOROTHIAZIDE 12.5 MG PO CAPS: 12.5000 mg | ORAL_CAPSULE | Freq: Every day | ORAL | 1 refills | Status: DC

## 2023-01-25 MED ORDER — ATORVASTATIN CALCIUM 10 MG PO TABS: 10.0000 mg | ORAL_TABLET | Freq: Every day | ORAL | 1 refills | Status: DC

## 2023-01-25 MED ORDER — LISINOPRIL 5 MG PO TABS: 5.0000 mg | ORAL_TABLET | Freq: Every day | ORAL | 1 refills | Status: DC

## 2023-01-25 MED ORDER — LEVOTHYROXINE SODIUM 88 MCG PO TABS: 88.0000 ug | ORAL_TABLET | Freq: Every day | ORAL | 1 refills | Status: DC

## 2023-01-25 NOTE — Progress Notes (Signed)
Date:  01/25/2023   Name:  Stacey Casey   DOB:  May 10, 1966   MRN:  [id number]   Chief Complaint: Hypertension (Added Lisinopril to hydrochlorothiazide approx 1 1/2 months ago) and Hyperlipidemia  Hypertension This is a chronic problem. The current episode started more than 1 year ago. The problem has been gradually improving since onset. The problem is controlled. Pertinent negatives include no anxiety, blurred vision, chest pain, headaches, orthopnea, palpitations, peripheral edema, PND or shortness of breath. There are no associated agents to hypertension. Past treatments include ACE inhibitors. The current treatment provides mild improvement. There are no compliance problems.  There is no history of angina, kidney disease, CAD/MI, CVA, heart failure, left ventricular hypertrophy, PVD or retinopathy. Identifiable causes of hypertension include a thyroid problem. There is no history of chronic renal disease, a hypertension causing med or renovascular disease.  Hyperlipidemia This is a chronic problem. The current episode started more than 1 year ago. The problem is controlled. She has no history of chronic renal disease. Pertinent negatives include no chest pain or shortness of breath. Current antihyperlipidemic treatment includes statins. The current treatment provides moderate improvement of lipids. There are no compliance problems.   Thyroid Problem Presents for follow-up visit. Patient reports no anxiety, cold intolerance, constipation, depressed mood, diaphoresis, diarrhea, dry skin, fatigue, hair loss, heat intolerance, hoarse voice, leg swelling, menstrual problem, nail problem, palpitations, tremors, visual change, weight gain or weight loss. Her past medical history is significant for hyperlipidemia. There is no history of heart failure.    Lab Results  Component Value Date   NA 143 12/06/2022   K 3.7 12/06/2022   CO2 25 12/06/2022   GLUCOSE 88 12/06/2022   BUN 20 12/06/2022    CREATININE 0.93 12/06/2022   CALCIUM 10.1 12/06/2022   EGFR 73 12/06/2022   GFRNONAA 79 01/27/2020   Lab Results  Component Value Date   CHOL 158 01/22/2023   HDL 50 01/22/2023   LDLCALC 89 01/22/2023   TRIG 103 01/22/2023   Lab Results  Component Value Date   TSH 4.490 12/06/2022   No results found for: "HGBA1C" Lab Results  Component Value Date   WBC 7.2 01/27/2020   HGB 11.7 (A) 01/27/2020   HCT 38 01/27/2020   PLT 214 01/27/2020   Lab Results  Component Value Date   ALT 15 01/22/2023   AST 16 01/22/2023   ALKPHOS 82 01/22/2023   BILITOT 0.4 01/22/2023   Lab Results  Component Value Date   VD25OH 29 01/27/2020     Review of Systems  Constitutional:  Negative for diaphoresis, fatigue, fever, unexpected weight change, weight gain and weight loss.  HENT:  Negative for congestion and hoarse voice.   Eyes:  Negative for blurred vision and visual disturbance.  Respiratory:  Negative for cough, chest tightness, shortness of breath, wheezing and stridor.   Cardiovascular:  Negative for chest pain, palpitations, orthopnea, leg swelling and PND.  Gastrointestinal:  Negative for constipation and diarrhea.  Endocrine: Negative for cold intolerance and heat intolerance.  Genitourinary:  Negative for menstrual problem.  Neurological:  Negative for tremors and headaches.  Psychiatric/Behavioral:  The patient is not nervous/anxious.     Patient Active Problem List   Diagnosis Date Noted   Colon cancer screening    Hypertension 03/06/2021   Hypothyroidism 03/06/2021   Vitamin D deficiency 03/06/2021   History of miscarriage 03/06/2021    Allergies  Allergen Reactions   Azithromycin Diarrhea    Past  Surgical History:  Procedure Laterality Date   arthroscopy of knee     BACK SURGERY     ruptured disc in L5-S1   CESAREAN SECTION  03/2002   COLONOSCOPY WITH PROPOFOL N/A 11/21/2021   Procedure: COLONOSCOPY WITH PROPOFOL;  Surgeon: Midge Minium, MD;  Location: [facility]  ENDOSCOPY;  Service: Endoscopy;  Laterality: N/A;    Social History   Tobacco Use   Smoking status: Never   Smokeless tobacco: Never  Vaping Use   Vaping Use: Never used  Substance Use Topics   Alcohol use: Yes    Comment: ocassionally   Drug use: Not Currently     Medication list has been reviewed and updated.  Current Meds  Medication Sig   atorvastatin (LIPITOR) 10 MG tablet TAKE 1 TABLET BY MOUTH EVERY DAY   calcium carbonate (OS-CAL) 600 MG TABS tablet Take 600 mg by mouth daily with breakfast.   hydrochlorothiazide (MICROZIDE) 12.5 MG capsule Take 1 capsule (12.5 mg total) by mouth daily.   levothyroxine (SYNTHROID) 88 MCG tablet Take 1 tablet (88 mcg total) by mouth daily.   lisinopril (ZESTRIL) 5 MG tablet Take 1 tablet (5 mg total) by mouth daily.   Multiple Vitamins-Minerals (MULTIVITAMIN WOMENS 50+ ADV PO) Take by mouth.       12/06/2022    2:38 PM 12/06/2022    2:36 PM 05/08/2022    4:06 PM 10/17/2021    9:40 AM  GAD 7 : Generalized Anxiety Score  Nervous, Anxious, on Edge 0 0 0 0  Control/stop worrying 0 0 0 0  Worry too much - different things 0 0 0 0  Trouble relaxing 0 0 0 0  Restless 0 0 0 0  Easily annoyed or irritable 0 0 0 0  Afraid - awful might happen 0 0 0 0  Total GAD 7 Score 0 0 0 0  Anxiety Difficulty Not difficult at all Not difficult at all Not difficult at all Not difficult at all       12/06/2022    2:38 PM 12/06/2022    2:36 PM 05/08/2022    4:06 PM  Depression screen PHQ 2/9  Decreased Interest 0 0 0  Down, Depressed, Hopeless 0 0 0  PHQ - 2 Score 0 0 0  Altered sleeping 0 0 0  Tired, decreased energy 0 0 0  Change in appetite 0 0 0  Feeling bad or failure about yourself  0 0 0  Trouble concentrating 0 0 0  Moving slowly or fidgety/restless 0 0 0  Suicidal thoughts 0 0 0  PHQ-9 Score 0 0 0  Difficult doing work/chores Not difficult at all Not difficult at all Not difficult at all    BP Readings from Last 3 Encounters:  01/25/23  120/78  12/06/22 132/88  06/26/22 128/72    Physical Exam Vitals and nursing note reviewed. Exam conducted with a chaperone present.  Constitutional:      General: She is not in acute distress.    Appearance: She is not diaphoretic.  HENT:     Head: Normocephalic and atraumatic.     Right Ear: Tympanic membrane, ear canal and external ear normal.     Left Ear: Tympanic membrane, ear canal and external ear normal.     Nose: Nose normal. No congestion or rhinorrhea.     Mouth/Throat:     Mouth: Mucous membranes are moist.     Pharynx: No oropharyngeal exudate or posterior oropharyngeal erythema.  Eyes:  General:        Right eye: No discharge.        Left eye: No discharge.     Conjunctiva/sclera: Conjunctivae normal.     Pupils: Pupils are equal, round, and reactive to light.  Neck:     Thyroid: No thyroid mass, thyromegaly or thyroid tenderness.     Vascular: No JVD.  Cardiovascular:     Rate and Rhythm: Normal rate and regular rhythm.     Heart sounds: Normal heart sounds. No murmur heard.    No friction rub. No gallop.  Pulmonary:     Effort: Pulmonary effort is normal.     Breath sounds: Normal breath sounds. No wheezing or rhonchi.  Abdominal:     General: Bowel sounds are normal.     Palpations: Abdomen is soft. There is no hepatomegaly, splenomegaly or mass.     Tenderness: There is no abdominal tenderness. There is no guarding.  Musculoskeletal:        General: Normal range of motion.     Cervical back: Normal range of motion and neck supple.  Lymphadenopathy:     Cervical: No cervical adenopathy.  Skin:    General: Skin is warm and dry.     Findings: No bruising or erythema.  Neurological:     Mental Status: She is alert.     Deep Tendon Reflexes: Reflexes are normal and symmetric.     Wt Readings from Last 3 Encounters:  01/25/23 172 lb (78 kg)  12/06/22 172 lb (78 kg)  06/26/22 173 lb (78.5 kg)    BP 120/78   Pulse 82   Ht 5\' 4"  (1.626 m)   Wt  172 lb (78 kg)   LMP 02/06/2021   SpO2 96%   BMI 29.52 kg/m   Assessment and Plan: 1. Familial hypercholesterolemia Chronic.  Controlled.  Stable.  Tolerating atorvastatin 10 mg once a day well.  Continue current dosing and will recheck in 6 months. - atorvastatin (LIPITOR) 10 MG tablet; Take 1 tablet (10 mg total) by mouth daily.  Dispense: 90 tablet; Refill: 1  2. Primary hypertension Chronic.  Controlled.  Stable.  Blood pressure 120/78.  Asymptomatic.  Tolerating medications well.  Continue combination of hydrochlorothiazide 12.5 mg and lisinopril 5 mg once a day.  Will recheck in 6 months with labs. - hydrochlorothiazide (MICROZIDE) 12.5 MG capsule; Take 1 capsule (12.5 mg total) by mouth daily.  Dispense: 90 capsule; Refill: 1 - lisinopril (ZESTRIL) 5 MG tablet; Take 1 tablet (5 mg total) by mouth daily.  Dispense: 90 tablet; Refill: 1  3. Hypothyroidism due to acquired atrophy of thyroid Chronic.  Controlled.  Stable.  Continue levothyroxine 88 mcg daily.  Will recheck in 6 months. - levothyroxine (SYNTHROID) 88 MCG tablet; Take 1 tablet (88 mcg total) by mouth daily.  Dispense: 90 tablet; Refill: 1     Elizabeth Sauer, MD

## 2023-02-12 ENCOUNTER — Ambulatory Visit: Payer: Self-pay | Admitting: *Deleted

## 2023-02-12 DIAGNOSIS — E034 Atrophy of thyroid (acquired): Secondary | ICD-10-CM

## 2023-02-12 DIAGNOSIS — E7801 Familial hypercholesterolemia: Secondary | ICD-10-CM

## 2023-02-12 DIAGNOSIS — I10 Essential (primary) hypertension: Secondary | ICD-10-CM

## 2023-02-12 MED ORDER — LEVOTHYROXINE SODIUM 88 MCG PO TABS
88.0000 ug | ORAL_TABLET | Freq: Every day | ORAL | 0 refills | Status: DC
Start: 2023-02-12 — End: 2023-08-09

## 2023-02-12 MED ORDER — ATORVASTATIN CALCIUM 10 MG PO TABS
10.0000 mg | ORAL_TABLET | Freq: Every day | ORAL | 0 refills | Status: DC
Start: 2023-02-12 — End: 2023-07-25

## 2023-02-12 MED ORDER — LISINOPRIL 5 MG PO TABS
5.0000 mg | ORAL_TABLET | Freq: Every day | ORAL | 0 refills | Status: DC
Start: 2023-02-12 — End: 2023-08-09

## 2023-02-12 MED ORDER — HYDROCHLOROTHIAZIDE 12.5 MG PO CAPS
12.5000 mg | ORAL_CAPSULE | Freq: Every day | ORAL | 0 refills | Status: DC
Start: 2023-02-12 — End: 2023-08-09

## 2023-02-12 NOTE — Telephone Encounter (Signed)
  Chief Complaint: patient request short term Rx- she forgot medication at home and is out of town  Disposition: [] ED /[] Urgent Care (no appt availability in office) / [] Appointment(In office/virtual)/ []  [facility]/ [x] Home Care/ [] Refused Recommended Disposition /[] [facility]/ []  Follow-up with PCP Additional Notes: Patient is out of town- family emergency. She forgot her medication and is requesting 5 day supply. Patient is up to date on appointments and just had Rx filled at home. Short term Rx sent

## 2023-02-12 NOTE — Telephone Encounter (Signed)
Summary: Pt request partial Rx refill for 4 medications while out of town   Pt reports that she had an emergency and had to go out of town due to her mother becoming ill. Pt stated that she forgot all her medications and would like to request possibly getting a 5 days supply of her medications to have while out of town. Pt stated she does not know the names of the 4 medications but would like the clinical staff to approve a partial Rx for the 4 medications that she currently takes. Cb# [phone]     [facility] [address] Malena Edman, PN Patient request 5 days Reason for Disposition . [1] Caller requesting a prescription renewal (no refills left), no triage required, AND [2] triager able to renew prescription per department policy  Answer Assessment - Initial Assessment Questions 1. NAME of MEDICINE: "What medicine(s) are you calling about?"     Patient had family emergency and left town without her medications- she is requesting a 5 day short term Rx.  2. QUESTION: "What is your question?" (e.g., double dose of medicine, side effect)     See above 3. PRESCRIBER: "Who prescribed the medicine?" Reason: if prescribed by specialist, call should be referred to that group.     PCP Patient advised will send short term Rx for her- she was just in office and all refilled.  Protocols used: Medication Question Call-A-AH, Medication Refill and Renewal Call-A-AH

## 2023-07-25 ENCOUNTER — Other Ambulatory Visit: Payer: Self-pay | Admitting: Family Medicine

## 2023-07-25 DIAGNOSIS — E7801 Familial hypercholesterolemia: Secondary | ICD-10-CM

## 2023-07-29 ENCOUNTER — Ambulatory Visit: Payer: BLUE CROSS/BLUE SHIELD | Admitting: Family Medicine

## 2023-08-06 ENCOUNTER — Ambulatory Visit: Payer: BLUE CROSS/BLUE SHIELD | Admitting: Family Medicine

## 2023-08-09 ENCOUNTER — Ambulatory Visit (INDEPENDENT_AMBULATORY_CARE_PROVIDER_SITE_OTHER): Payer: BLUE CROSS/BLUE SHIELD | Admitting: Family Medicine

## 2023-08-09 ENCOUNTER — Encounter: Payer: Self-pay | Admitting: Family Medicine

## 2023-08-09 VITALS — BP 122/76 | HR 76 | Ht 64.0 in | Wt 171.0 lb

## 2023-08-09 DIAGNOSIS — I10 Essential (primary) hypertension: Secondary | ICD-10-CM | POA: Diagnosis not present

## 2023-08-09 DIAGNOSIS — E7801 Familial hypercholesterolemia: Secondary | ICD-10-CM | POA: Diagnosis not present

## 2023-08-09 DIAGNOSIS — G5602 Carpal tunnel syndrome, left upper limb: Secondary | ICD-10-CM

## 2023-08-09 DIAGNOSIS — E034 Atrophy of thyroid (acquired): Secondary | ICD-10-CM | POA: Diagnosis not present

## 2023-08-09 MED ORDER — LISINOPRIL 5 MG PO TABS: 5.0000 mg | ORAL_TABLET | Freq: Every day | ORAL | 1 refills | Status: DC

## 2023-08-09 MED ORDER — HYDROCHLOROTHIAZIDE 12.5 MG PO CAPS: 12.5000 mg | ORAL_CAPSULE | Freq: Every day | ORAL | 1 refills | Status: DC

## 2023-08-09 MED ORDER — LEVOTHYROXINE SODIUM 88 MCG PO TABS: 88.0000 ug | ORAL_TABLET | Freq: Every day | ORAL | 1 refills | Status: DC

## 2023-08-09 MED ORDER — ATORVASTATIN CALCIUM 10 MG PO TABS: 10.0000 mg | ORAL_TABLET | Freq: Every day | ORAL | 1 refills | Status: DC

## 2023-08-09 MED ORDER — MELOXICAM 7.5 MG PO TABS: 7.5000 mg | ORAL_TABLET | Freq: Every day | ORAL | 0 refills | Status: DC

## 2023-08-09 NOTE — Progress Notes (Signed)
 Date:  08/09/2023   Name:  Stacey Casey   DOB:  March 29, 1966   MRN:  [id number]   Chief Complaint: Hypertension, Hypothyroidism, and Hyperlipidemia  Hypertension This is a chronic problem. The current episode started more than 1 year ago. The problem has been gradually improving since onset. The problem is controlled. Pertinent negatives include no blurred vision, chest pain, headaches, neck pain, orthopnea, palpitations, peripheral edema, PND, shortness of breath or sweats. There are no associated agents to hypertension. There are no known risk factors for coronary artery disease. Past treatments include ACE inhibitors and diuretics. There are no compliance problems.  There is no history of CAD/MI or CVA. There is no history of chronic renal disease, a hypertension causing med or renovascular disease.  Hyperlipidemia This is a chronic problem. The current episode started more than 1 year ago. The problem is controlled. Recent lipid tests were reviewed and are normal. She has no history of chronic renal disease, diabetes, hypothyroidism, liver disease, obesity or nephrotic syndrome. Pertinent negatives include no chest pain, focal sensory loss, focal weakness, leg pain, myalgias or shortness of breath. Current antihyperlipidemic treatment includes statins. The current treatment provides moderate improvement of lipids. There are no compliance problems.  Risk factors for coronary artery disease include dyslipidemia and hypertension.  Hand Pain  The injury mechanism was repetitive motion. The pain is present in the left hand. Associated symptoms include numbness. Pertinent negatives include no chest pain.    Lab Results  Component Value Date   NA 143 12/06/2022   K 3.7 12/06/2022   CO2 25 12/06/2022   GLUCOSE 88 12/06/2022   BUN 20 12/06/2022   CREATININE 0.93 12/06/2022   CALCIUM  10.1 12/06/2022   EGFR 73 12/06/2022   GFRNONAA 79 01/27/2020   Lab Results  Component Value Date   CHOL  158 01/22/2023   HDL 50 01/22/2023   LDLCALC 89 01/22/2023   TRIG 103 01/22/2023   Lab Results  Component Value Date   TSH 4.490 12/06/2022   No results found for: HGBA1C Lab Results  Component Value Date   WBC 7.2 01/27/2020   HGB 11.7 (A) 01/27/2020   HCT 38 01/27/2020   PLT 214 01/27/2020   Lab Results  Component Value Date   ALT 15 01/22/2023   AST 16 01/22/2023   ALKPHOS 82 01/22/2023   BILITOT 0.4 01/22/2023   Lab Results  Component Value Date   VD25OH 29 01/27/2020     Review of Systems  Constitutional:  Negative for fatigue and unexpected weight change.  Eyes:  Negative for blurred vision and visual disturbance.  Respiratory:  Negative for cough, chest tightness, shortness of breath and wheezing.   Cardiovascular:  Negative for chest pain, palpitations, orthopnea and PND.  Gastrointestinal:  Negative for abdominal distention and abdominal pain.  Endocrine: Negative for polydipsia and polyuria.  Genitourinary:  Negative for hematuria.  Musculoskeletal:  Negative for myalgias and neck pain.  Neurological:  Positive for numbness. Negative for focal weakness and headaches.    Patient Active Problem List   Diagnosis Date Noted   Colon cancer screening    Hypertension 03/06/2021   Hypothyroidism 03/06/2021   Vitamin D deficiency 03/06/2021   History of miscarriage 03/06/2021    Allergies  Allergen Reactions   Azithromycin Diarrhea    Past Surgical History:  Procedure Laterality Date   arthroscopy of knee     BACK SURGERY     ruptured disc in L5-S1   CESAREAN SECTION  03/2002   COLONOSCOPY WITH PROPOFOL  N/A 11/21/2021   Procedure: COLONOSCOPY WITH PROPOFOL ;  Surgeon: Jinny Carmine, MD;  Location: [facility] ENDOSCOPY;  Service: Endoscopy;  Laterality: N/A;    Social History   Tobacco Use   Smoking status: Never   Smokeless tobacco: Never  Vaping Use   Vaping status: Never Used  Substance Use Topics   Alcohol use: Yes    Comment: ocassionally    Drug use: Not Currently     Medication list has been reviewed and updated.  Current Meds  Medication Sig   atorvastatin  (LIPITOR) 10 MG tablet TAKE 1 TABLET BY MOUTH EVERY DAY   calcium  carbonate (OS-CAL) 600 MG TABS tablet Take 600 mg by mouth daily with breakfast.   hydrochlorothiazide  (MICROZIDE ) 12.5 MG capsule Take 1 capsule (12.5 mg total) by mouth daily.   levothyroxine  (SYNTHROID ) 88 MCG tablet Take 1 tablet (88 mcg total) by mouth daily.   lisinopril  (ZESTRIL ) 5 MG tablet Take 1 tablet (5 mg total) by mouth daily.   Multiple Vitamins-Minerals (MULTIVITAMIN WOMENS 50+ ADV PO) Take by mouth.       12/06/2022    2:38 PM 12/06/2022    2:36 PM 05/08/2022    4:06 PM 10/17/2021    9:40 AM  GAD 7 : Generalized Anxiety Score  Nervous, Anxious, on Edge 0 0 0 0  Control/stop worrying 0 0 0 0  Worry too much - different things 0 0 0 0  Trouble relaxing 0 0 0 0  Restless 0 0 0 0  Easily annoyed or irritable 0 0 0 0  Afraid - awful might happen 0 0 0 0  Total GAD 7 Score 0 0 0 0  Anxiety Difficulty Not difficult at all Not difficult at all Not difficult at all Not difficult at all       12/06/2022    2:38 PM 12/06/2022    2:36 PM 05/08/2022    4:06 PM  Depression screen PHQ 2/9  Decreased Interest 0 0 0  Down, Depressed, Hopeless 0 0 0  PHQ - 2 Score 0 0 0  Altered sleeping 0 0 0  Tired, decreased energy 0 0 0  Change in appetite 0 0 0  Feeling bad or failure about yourself  0 0 0  Trouble concentrating 0 0 0  Moving slowly or fidgety/restless 0 0 0  Suicidal thoughts 0 0 0  PHQ-9 Score 0 0 0  Difficult doing work/chores Not difficult at all Not difficult at all Not difficult at all    BP Readings from Last 3 Encounters:  08/09/23 122/76  01/25/23 120/78  12/06/22 132/88    Physical Exam Vitals and nursing note reviewed. Exam conducted with a chaperone present.  Constitutional:      General: She is not in acute distress.    Appearance: She is not diaphoretic.  HENT:      Head: Normocephalic and atraumatic.     Right Ear: Tympanic membrane and external ear normal.     Left Ear: Tympanic membrane and external ear normal.     Nose: Nose normal.     Mouth/Throat:     Mouth: Mucous membranes are moist.  Eyes:     General:        Right eye: No discharge.        Left eye: No discharge.     Conjunctiva/sclera: Conjunctivae normal.     Pupils: Pupils are equal, round, and reactive to light.  Neck:  Thyroid : No thyromegaly.     Vascular: No JVD.  Cardiovascular:     Rate and Rhythm: Normal rate and regular rhythm.     Heart sounds: Normal heart sounds. No murmur heard.    No friction rub. No gallop.  Pulmonary:     Effort: Pulmonary effort is normal.     Breath sounds: Normal breath sounds. No wheezing, rhonchi or rales.  Abdominal:     General: Bowel sounds are normal.     Palpations: Abdomen is soft. There is no mass.     Tenderness: There is no abdominal tenderness. There is no guarding.  Musculoskeletal:        General: Normal range of motion.     Cervical back: Normal range of motion and neck supple.     Comments: Negative Tinel.  Positive Phalen.  This is noted on the left hand.  In the median distribution.  Lymphadenopathy:     Cervical: No cervical adenopathy.  Skin:    General: Skin is warm and dry.  Neurological:     Mental Status: She is alert.     Deep Tendon Reflexes: Reflexes are normal and symmetric.     Wt Readings from Last 3 Encounters:  08/09/23 171 lb (77.6 kg)  01/25/23 172 lb (78 kg)  12/06/22 172 lb (78 kg)    BP 122/76 (BP Location: Right Arm, Patient Position: Sitting)   Pulse 76   Ht 5' 4 (1.626 m)   Wt 171 lb (77.6 kg)   LMP 02/06/2021   SpO2 98%   BMI 29.35 kg/m   Assessment and Plan:  1. Primary hypertension (Primary) Chronic.  Controlled.  Stable.  Blood pressure 122/76.  Asymptomatic.  Tolerating medication well.  Patient has no cough at this time.  Continue hydrochlorothiazide  12.5 mg once a day  and lisinopril  5 mg once a day.  Will check renal function panel for GFR and electrolytes.  Will recheck patient in 6 months. - hydrochlorothiazide  (MICROZIDE ) 12.5 MG capsule; Take 1 capsule (12.5 mg total) by mouth daily.  Dispense: 90 capsule; Refill: 1 - lisinopril  (ZESTRIL ) 5 MG tablet; Take 1 tablet (5 mg total) by mouth daily.  Dispense: 90 tablet; Refill: 1 - Renal Function Panel  2. Familial hypercholesterolemia Chronic.  Controlled.  Stable.  Asymptomatic without myalgias or muscle weakness.  Continue atorvastatin  10 mg once a day.  Will check lipid panel for current level of LDL control. - atorvastatin  (LIPITOR) 10 MG tablet; Take 1 tablet (10 mg total) by mouth daily.  Dispense: 90 tablet; Refill: 1 - Lipid Panel With LDL/HDL Ratio  3. Hypothyroidism due to acquired atrophy of thyroid  Chronic.  Controlled.  Stable.  No symptomatology to suggest abnormal thyroid  supplementation.  Likely will continue on 88 mcg daily.  Will check TSH to confirm level of control is appropriate on this dosing.  New onset.  Patient has paresthesias in the median nerve distribution of the left hand and mildly so on the right as well patient uses a brace at night which helps somewhat but is still having some issues.  She has a positive Phalen on the left but the Tinel sign is normal.  We will initiate meloxicam  7.5 mg once a day and refer to sports medicine. - levothyroxine  (SYNTHROID ) 88 MCG tablet; Take 1 tablet (88 mcg total) by mouth daily.  Dispense: 90 tablet; Refill: 1 - TSH  4. Carpal tunnel syndrome on left New onset.  Episodic.  Awakens at night with paresthesias primarily at  the left hand.  Examination is positive for Phalen sign on the left negative for the Tinel.  Will initiate meloxicam  7.5 mg daily and refer to sports medicine for evaluation. - meloxicam  (MOBIC ) 7.5 MG tablet; Take 1 tablet (7.5 mg total) by mouth daily.  Dispense: 30 tablet; Refill: 0    Cathryne Molt, MD

## 2023-08-10 ENCOUNTER — Encounter: Payer: Self-pay | Admitting: Family Medicine

## 2023-08-10 LAB — RENAL FUNCTION PANEL
Albumin: 4.5 g/dL (ref 3.8–4.9)
BUN/Creatinine Ratio: 26 — ABNORMAL HIGH (ref 9–23)
BUN: 24 mg/dL (ref 6–24)
CO2: 25 mmol/L (ref 20–29)
Calcium: 9.8 mg/dL (ref 8.7–10.2)
Chloride: 102 mmol/L (ref 96–106)
Creatinine, Ser: 0.92 mg/dL (ref 0.57–1.00)
Glucose: 86 mg/dL (ref 70–99)
Phosphorus: 3.6 mg/dL (ref 3.0–4.3)
Potassium: 3.9 mmol/L (ref 3.5–5.2)
Sodium: 143 mmol/L (ref 134–144)
eGFR: 74 mL/min/{1.73_m2} (ref >59)

## 2023-08-10 LAB — LIPID PANEL WITH LDL/HDL RATIO
Cholesterol, Total: 178 mg/dL (ref 100–199)
HDL: 52 mg/dL (ref >39)
LDL Chol Calc (NIH): 108 mg/dL — ABNORMAL HIGH (ref 0–99)
LDL/HDL Ratio: 2.1 {ratio} (ref 0.0–3.2)
Triglycerides: 97 mg/dL (ref 0–149)
VLDL Cholesterol Cal: 18 mg/dL (ref 5–40)

## 2023-08-10 LAB — TSH: TSH: 4.41 u[IU]/mL (ref 0.450–4.500)

## 2023-08-13 ENCOUNTER — Ambulatory Visit (INDEPENDENT_AMBULATORY_CARE_PROVIDER_SITE_OTHER): Payer: BLUE CROSS/BLUE SHIELD | Admitting: Family Medicine

## 2023-08-13 ENCOUNTER — Encounter: Payer: BLUE CROSS/BLUE SHIELD | Admitting: Family Medicine

## 2023-08-13 ENCOUNTER — Encounter: Payer: Self-pay | Admitting: Family Medicine

## 2023-08-13 VITALS — BP 120/80 | HR 83 | Ht 64.0 in | Wt 172.2 lb

## 2023-08-13 DIAGNOSIS — G5603 Carpal tunnel syndrome, bilateral upper limbs: Secondary | ICD-10-CM | POA: Diagnosis not present

## 2023-08-13 MED ORDER — MELOXICAM 15 MG PO TABS: 15.0000 mg | ORAL_TABLET | Freq: Every day | ORAL | 0 refills | Status: AC | PRN

## 2023-08-13 NOTE — Assessment & Plan Note (Signed)
 History of Present Illness The patient, with a long-standing history of bilateral wrist pain, presents with worsening symptoms despite the use of a wrist brace. The pain, which has been ongoing for years, is more severe on the left side and is associated with activities such as riding and sewing. The patient reports that the wrist brace used to alleviate the symptoms, but recently the pain persists despite its use. The patient also experiences numbness in the index and middle fingers of both hands, and occasionally in the pinky finger. The numbness seems to be related to the position of the wrist and the rotation of the arm. The patient has not had any previous treatments other than the wrist brace.  Physical Exam MUSCULOSKELETAL: No thenar atrophy bilaterally. Sensation symmetric and intact in both hands. Normal motor function in the hands, with no evidence of motor deficit. Negative Phalen's test on the right, equivocal on the left. Negative Tinel's sign at the ulnar groove on the right, positive at the ulnar groove on the left. NEUROLOGICAL: Sensation intact in radial, ulnar, and median distributions. Motor function intact in radial, ulnar, and median distributions.  Assessment and Plan Bilateral carpal tunnel syndrome, concern for secondary ulnar neuropathy Chronic wrist pain, worse on the left, with symptoms suggestive of median and ulnar nerve involvement. No prior interventions beyond activity modification and wrist brace use. No prior imaging or nerve conduction studies. -Continue use of wrist braces for both wrists, increasing to 24/7 use as tolerated. -Start Meloxicam  15mg  daily (two 7.5mg  tablets) for two weeks, then as needed. -Initiate home exercises at least once daily. -Check in via MyChart in 2-4 weeks or sooner if symptoms worsen.  If symptoms persist or worsen, consider -Steroid injection for more immediate, short-term relief. -Referral for imaging or nerve conduction studies to  further evaluate the cause of symptoms. -Surgical consultation if conservative management fails.

## 2023-08-13 NOTE — Progress Notes (Signed)
 Primary Care / Sports Medicine Office Visit  Patient Information:  Patient ID: Stacey Casey, female DOB: 26-Jul-1966 Age: 58 y.o. MRN: [id number]   Stacey Casey is a pleasant 58 y.o. female presenting with the following:  Chief Complaint  Patient presents with   Wrist Pain    Patient presents with bil wrist pain L>R. Wears wrist brace at night. Patient has had pain in her wrist for years. She does not take any pain medications. Dr. Joshua has prescribed her Meloxicam , she has not started this medication yet. Holding phone for long periods and riding her motorcycle her thumb, point and middle finger goes numb.     Vitals:   08/13/23 1009  BP: 120/80  Pulse: 83  SpO2: 95%   Vitals:   08/13/23 1009  Weight: 172 lb 3.2 oz (78.1 kg)  Height: 5' 4 (1.626 m)   Body mass index is 29.56 kg/m.  No results found.   Independent interpretation of notes and tests performed by another provider:   None  Procedures performed:   None  Pertinent History, Exam, Impression, and Recommendations:   Problem List Items Addressed This Visit     Bilateral carpal tunnel syndrome - Primary   History of Present Illness The patient, with a long-standing history of bilateral wrist pain, presents with worsening symptoms despite the use of a wrist brace. The pain, which has been ongoing for years, is more severe on the left side and is associated with activities such as riding and sewing. The patient reports that the wrist brace used to alleviate the symptoms, but recently the pain persists despite its use. The patient also experiences numbness in the index and middle fingers of both hands, and occasionally in the pinky finger. The numbness seems to be related to the position of the wrist and the rotation of the arm. The patient has not had any previous treatments other than the wrist brace.  Physical Exam MUSCULOSKELETAL: No thenar atrophy bilaterally. Sensation symmetric and intact in  both hands. Normal motor function in the hands, with no evidence of motor deficit. Negative Phalen's test on the right, equivocal on the left. Negative Tinel's sign at the ulnar groove on the right, positive at the ulnar groove on the left. NEUROLOGICAL: Sensation intact in radial, ulnar, and median distributions. Motor function intact in radial, ulnar, and median distributions.  Assessment and Plan Bilateral carpal tunnel syndrome, concern for secondary ulnar neuropathy Chronic wrist pain, worse on the left, with symptoms suggestive of median and ulnar nerve involvement. No prior interventions beyond activity modification and wrist brace use. No prior imaging or nerve conduction studies. -Continue use of wrist braces for both wrists, increasing to 24/7 use as tolerated. -Start Meloxicam  15mg  daily (two 7.5mg  tablets) for two weeks, then as needed. -Initiate home exercises at least once daily. -Check in via MyChart in 2-4 weeks or sooner if symptoms worsen.  If symptoms persist or worsen, consider -Steroid injection for more immediate, short-term relief. -Referral for imaging or nerve conduction studies to further evaluate the cause of symptoms. -Surgical consultation if conservative management fails.        Orders & Medications Medications:  Meds ordered this encounter  Medications   meloxicam  (MOBIC ) 15 MG tablet    Sig: Take 1 tablet (15 mg total) by mouth daily as needed for pain.    Dispense:  30 tablet    Refill:  0   No orders of the defined types were placed in  this encounter.    No follow-ups on file.     Selinda JINNY Ku, MD, [facility]   Primary Care Sports Medicine Primary Care and Sports Medicine at [facility]

## 2023-08-13 NOTE — Patient Instructions (Addendum)
 Patient Plan for Bilateral Wrist Pain:  Bilateral Wrist Pain (carpal tunnel syndrome and secondary ulnar nerve involvement): - You are experiencing chronic pain in both wrists/hands, with the left side being more affected, and symptoms suggesting nerve involvement in your wrists. - Continue using wrist braces, increasing to 24/7 use as tolerated to provide support and alleviate pain. - Begin taking Meloxicam  15mg  daily for two weeks (take with food) to manage inflammation and pain, then transition to using it as needed based on your symptoms. - Start a routine of home exercises for your wrist with information provided and below at least once daily to improve flexibility and strength. -Contact us  at 2-4 weeks or beyond via MyChart if symptoms persist or worsen

## 2023-09-17 ENCOUNTER — Encounter: Payer: Self-pay | Admitting: Family Medicine

## 2023-10-18 ENCOUNTER — Ambulatory Visit: Payer: BLUE CROSS/BLUE SHIELD | Admitting: Certified Nurse Midwife

## 2023-10-29 ENCOUNTER — Encounter: Payer: Self-pay | Admitting: Certified Nurse Midwife

## 2023-10-29 ENCOUNTER — Other Ambulatory Visit (HOSPITAL_COMMUNITY)
Admission: RE | Admit: 2023-10-29 | Discharge: 2023-10-29 | Disposition: A | Discharge status: Home / Self Care | Source: Ambulatory Visit | Attending: Certified Nurse Midwife | Admitting: Certified Nurse Midwife

## 2023-10-29 ENCOUNTER — Ambulatory Visit (INDEPENDENT_AMBULATORY_CARE_PROVIDER_SITE_OTHER): Admitting: Certified Nurse Midwife

## 2023-10-29 VITALS — BP 128/83 | HR 79 | Ht 64.0 in | Wt 170.6 lb

## 2023-10-29 DIAGNOSIS — Z01419 Encounter for gynecological examination (general) (routine) without abnormal findings: Secondary | ICD-10-CM

## 2023-10-29 DIAGNOSIS — Z124 Encounter for screening for malignant neoplasm of cervix: Secondary | ICD-10-CM | POA: Insufficient documentation

## 2023-10-29 DIAGNOSIS — Z1151 Encounter for screening for human papillomavirus (HPV): Secondary | ICD-10-CM | POA: Diagnosis not present

## 2023-10-29 DIAGNOSIS — Z1231 Encounter for screening mammogram for malignant neoplasm of breast: Secondary | ICD-10-CM

## 2023-10-29 NOTE — Patient Instructions (Signed)
Health Maintenance, Female Adopting a healthy lifestyle and getting preventive care are important in promoting health and wellness. Ask your health care provider about: The right schedule for you to have regular tests and exams. Things you can do on your own to prevent diseases and keep yourself healthy. What should I know about diet, weight, and exercise? Eat a healthy diet  Eat a diet that includes plenty of vegetables, fruits, low-fat dairy products, and lean protein. Do not eat a lot of foods that are high in solid fats, added sugars, or sodium. Maintain a healthy weight Body mass index (BMI) is used to identify weight problems. It estimates body fat based on height and weight. Your health care provider can help determine your BMI and help you achieve or maintain a healthy weight. Get regular exercise Get regular exercise. This is one of the most important things you can do for your health. Most adults should: Exercise for at least 150 minutes each week. The exercise should increase your heart rate and make you sweat (moderate-intensity exercise). Do strengthening exercises at least twice a week. This is in addition to the moderate-intensity exercise. Spend less time sitting. Even light physical activity can be beneficial. Watch cholesterol and blood lipids Have your blood tested for lipids and cholesterol at 56 years of age, then have this test every 5 years. Have your cholesterol levels checked more often if: Your lipid or cholesterol levels are high. You are older than 56 years of age. You are at high risk for heart disease. What should I know about cancer screening? Depending on your health history and family history, you may need to have cancer screening at various ages. This may include screening for: Breast cancer. Cervical cancer. Colorectal cancer. Skin cancer. Lung cancer. What should I know about heart disease, diabetes, and high blood pressure? Blood pressure and heart  disease High blood pressure causes heart disease and increases the risk of stroke. This is more likely to develop in people who have high blood pressure readings or are overweight. Have your blood pressure checked: Every 3-5 years if you are 18-39 years of age. Every year if you are 40 years old or older. Diabetes Have regular diabetes screenings. This checks your fasting blood sugar level. Have the screening done: Once every three years after age 40 if you are at a normal weight and have a low risk for diabetes. More often and at a younger age if you are overweight or have a high risk for diabetes. What should I know about preventing infection? Hepatitis B If you have a higher risk for hepatitis B, you should be screened for this virus. Talk with your health care provider to find out if you are at risk for hepatitis B infection. Hepatitis C Testing is recommended for: Everyone born from 1945 through 1965. Anyone with known risk factors for hepatitis C. Sexually transmitted infections (STIs) Get screened for STIs, including gonorrhea and chlamydia, if: You are sexually active and are younger than 56 years of age. You are older than 56 years of age and your health care provider tells you that you are at risk for this type of infection. Your sexual activity has changed since you were last screened, and you are at increased risk for chlamydia or gonorrhea. Ask your health care provider if you are at risk. Ask your health care provider about whether you are at high risk for HIV. Your health care provider may recommend a prescription medicine to help prevent HIV   infection. If you choose to take medicine to prevent HIV, you should first get tested for HIV. You should then be tested every 3 months for as long as you are taking the medicine. Pregnancy If you are about to stop having your period (premenopausal) and you may become pregnant, seek counseling before you get pregnant. Take 400 to 800  micrograms (mcg) of folic acid every day if you become pregnant. Ask for birth control (contraception) if you want to prevent pregnancy. Osteoporosis and menopause Osteoporosis is a disease in which the bones lose minerals and strength with aging. This can result in bone fractures. If you are 65 years old or older, or if you are at risk for osteoporosis and fractures, ask your health care provider if you should: Be screened for bone loss. Take a calcium or vitamin D supplement to lower your risk of fractures. Be given hormone replacement therapy (HRT) to treat symptoms of menopause. Follow these instructions at home: Alcohol use Do not drink alcohol if: Your health care provider tells you not to drink. You are pregnant, may be pregnant, or are planning to become pregnant. If you drink alcohol: Limit how much you have to: 0-1 drink a day. Know how much alcohol is in your drink. In the [place] one drink equals one 12 oz bottle of beer (355 mL), one 5 oz glass of wine (148 mL), or one 1 oz glass of hard liquor (44 mL). Lifestyle Do not use any products that contain nicotine or tobacco. These products include cigarettes, chewing tobacco, and vaping devices, such as e-cigarettes. If you need help quitting, ask your health care provider. Do not use street drugs. Do not share needles. Ask your health care provider for help if you need support or information about quitting drugs. General instructions Schedule regular health, dental, and eye exams. Stay current with your vaccines. Tell your health care provider if: You often feel depressed. You have ever been abused or do not feel safe at home. Summary Adopting a healthy lifestyle and getting preventive care are important in promoting health and wellness. Follow your health care provider's instructions about healthy diet, exercising, and getting tested or screened for diseases. Follow your health care provider's instructions on monitoring your  cholesterol and blood pressure. This information is not intended to replace advice given to you by your health care provider. Make sure you discuss any questions you have with your health care provider. Document Revised: 12/12/2020 Document Reviewed: 12/12/2020 Elsevier Patient Education  2024 [employer]vier Inc.  American Heart Association (AHA) Exercise Recommendation  Being physically active is important to prevent heart disease and stroke, the nation's No. 1and No. 5killers. To improve overall cardiovascular health, we suggest at least 150 minutes per week of moderate exercise or 75 minutes per week of vigorous exercise (or a combination of moderate and vigorous activity). Thirty minutes a day, five times a week is an easy goal to remember. You will also experience benefits even if you divide your time into two or three segments of 10 to 15 minutes per day.  For people who would benefit from lowering their blood pressure or cholesterol, we recommend 40 minutes of aerobic exercise of moderate to vigorous intensity three to four times a week to lower the risk for heart attack and stroke.  Physical activity is anything that makes you move your body and burn calories.  This includes things like climbing stairs or playing sports. Aerobic exercises benefit your heart, and include walking, jogging, swimming or   biking. Strength and stretching exercises are best for overall stamina and flexibility.  The simplest, positive change you can make to effectively improve your heart health is to start walking. It's enjoyable, free, easy, social and great exercise. A walking program is flexible and boasts high success rates because people can stick with it. It's easy for walking to become a regular and satisfying part of life.   For Overall Cardiovascular Health: At least 30 minutes of moderate-intensity aerobic activity at least 5 days per week for a total of 150  OR  At least 25 minutes of vigorous aerobic  activity at least 3 days per week for a total of 75 minutes; or a combination of moderate- and vigorous-intensity aerobic activity  AND  Moderate- to high-intensity muscle-strengthening activity at least 2 days per week for additional health benefits.  For Lowering Blood Pressure and Cholesterol An average 40 minutes of moderate- to vigorous-intensity aerobic activity 3 or 4 times per week  What if I can't make it to the time goal? Something is always better than nothing! And everyone has to start somewhere. Even if you've been sedentary for years, today is the day you can begin to make healthy changes in your life. If you don't think you'll make it for 30 or 40 minutes, set a reachable goal for today. You can work up toward your overall goal by increasing your time as you get stronger. Don't let all-or-nothing thinking rob you of doing what you can every day.  Source:http://www.heart.org    

## 2023-10-29 NOTE — Progress Notes (Signed)
 ANNUAL EXAM Patient name: Stacey Casey MRN [id number]  Date of birth: 02-06-1966 Chief Complaint:   Annual Exam  History of Present Illness:   Stacey Casey is a 58 y.o. G22P1011 Caucasian female being seen today for a routine annual exam.  Current complaints: none  Patient's last menstrual period was 02/06/2021.   The pregnancy intention screening data noted above was reviewed. Potential methods of contraception were discussed. The patient elected to proceed with No data recorded.   No results found for: "DIAGPAP", "HPVHIGH", "ADEQPAP"     Last pap 2021. Results were:  NILM . H/O abnormal pap: no Last mammogram: 6/24. Results were: normal. Family h/o breast cancer: no Last colonoscopy: 2023. Results were: normal. Family h/o colorectal cancer: yes father     08/13/2023   10:20 AM 08/09/2023    8:10 AM 12/06/2022    2:38 PM 12/06/2022    2:36 PM 05/08/2022    4:06 PM  Depression screen PHQ 2/9  Decreased Interest 0 0 0 0 0  Down, Depressed, Hopeless 0 0 0 0 0  PHQ - 2 Score 0 0 0 0 0  Altered sleeping  0 0 0 0  Tired, decreased energy  0 0 0 0  Change in appetite  0 0 0 0  Feeling bad or failure about yourself   0 0 0 0  Trouble concentrating  0 0 0 0  Moving slowly or fidgety/restless  0 0 0 0  Suicidal thoughts  0 0 0 0  PHQ-9 Score  0 0 0 0  Difficult doing work/chores  Not difficult at all Not difficult at all Not difficult at all Not difficult at all        08/09/2023    8:10 AM 12/06/2022    2:38 PM 12/06/2022    2:36 PM 05/08/2022    4:06 PM  GAD 7 : Generalized Anxiety Score  Nervous, Anxious, on Edge 0 0 0 0  Control/stop worrying 0 0 0 0  Worry too much - different things 0 0 0 0  Trouble relaxing 0 0 0 0  Restless 0 0 0 0  Easily annoyed or irritable 0 0 0 0  Afraid - awful might happen 0 0 0 0  Total GAD 7 Score 0 0 0 0  Anxiety Difficulty Not difficult at all Not difficult at all Not difficult at all Not difficult at all     Review of Systems:    Pertinent items are noted in HPI Denies any headaches, blurred vision, fatigue, shortness of breath, chest pain, abdominal pain, abnormal vaginal discharge/itching/odor/irritation, problems with periods, bowel movements, urination, or intercourse unless otherwise stated above. Pertinent History Reviewed:  Reviewed past medical,surgical, social and family history.  Reviewed problem list, medications and allergies. Physical Assessment:   Vitals:   10/29/23 0828  BP: 128/83  Pulse: 79  Weight: 170 lb 9.6 oz (77.4 kg)  Height: 5\' 4"  (1.626 m)  Body mass index is 29.28 kg/m.       Physical Exam Vitals reviewed.  Constitutional:      Appearance: Normal appearance.  HENT:     Head: Normocephalic.  Neck:     Thyroid: No thyroid mass or thyromegaly.  Cardiovascular:     Rate and Rhythm: Normal rate and regular rhythm.     Heart sounds: Normal heart sounds.  Pulmonary:     Effort: Pulmonary effort is normal.     Breath sounds: Normal breath sounds.  Chest:  Breasts:  Tanner Score is 5.     Right: Normal.     Left: Normal.  Abdominal:     General: Abdomen is flat.     Palpations: Abdomen is soft.     Tenderness: There is no abdominal tenderness.  Genitourinary:    General: Normal vulva.     Tanner stage (genital): 5.     Vagina: Normal.     Cervix: Normal.     Uterus: Normal. Not fixed and not tender.      Adnexa:        Right: No mass or tenderness.         Left: No mass or tenderness.    Musculoskeletal:     Cervical back: Neck supple. No tenderness.  Skin:    General: Skin is warm and dry.  Neurological:     General: No focal deficit present.     Mental Status: She is alert and oriented to person, place, and time.  Psychiatric:        Mood and Affect: Mood normal.        Behavior: Behavior normal.      No results found for this or any previous visit (from the past 24 hours).  Assessment & Plan:  1. Well woman exam (Primary)  2. Cervical cancer  screening - Cytology - PAP ([place])  3. Encounter for screening for human papillomavirus (HPV) - Cytology - PAP ([place])   Mammogram:  ordered today, schedule in June , or sooner if problems Colonoscopy: per GI, or sooner if problems  No orders of the defined types were placed in this encounter.   Meds: No orders of the defined types were placed in this encounter.   Follow-up: Return in 1 year (on 10/28/2024) for Annual exam.  Dominica Severin, CNM 10/29/2023 8:39 AM

## 2023-10-30 ENCOUNTER — Encounter: Payer: Self-pay | Admitting: Certified Nurse Midwife

## 2023-10-30 LAB — CYTOLOGY - PAP
Comment: NEGATIVE
Diagnosis: NEGATIVE
High risk HPV: NEGATIVE

## 2023-12-16 ENCOUNTER — Other Ambulatory Visit: Payer: Self-pay | Admitting: Family Medicine

## 2023-12-16 DIAGNOSIS — Z1231 Encounter for screening mammogram for malignant neoplasm of breast: Secondary | ICD-10-CM

## 2023-12-20 ENCOUNTER — Ambulatory Visit: Payer: Self-pay | Admitting: Family Medicine

## 2023-12-24 ENCOUNTER — Encounter: Payer: Self-pay | Admitting: Family Medicine

## 2023-12-24 ENCOUNTER — Ambulatory Visit (INDEPENDENT_AMBULATORY_CARE_PROVIDER_SITE_OTHER): Admitting: Family Medicine

## 2023-12-24 VITALS — BP 124/80 | HR 79 | Ht 64.0 in | Wt 174.0 lb

## 2023-12-24 DIAGNOSIS — I10 Essential (primary) hypertension: Secondary | ICD-10-CM | POA: Diagnosis not present

## 2023-12-24 DIAGNOSIS — E7801 Familial hypercholesterolemia: Secondary | ICD-10-CM | POA: Diagnosis not present

## 2023-12-24 DIAGNOSIS — E034 Atrophy of thyroid (acquired): Secondary | ICD-10-CM | POA: Diagnosis not present

## 2023-12-24 MED ORDER — LISINOPRIL 5 MG PO TABS: 5.0000 mg | ORAL_TABLET | Freq: Every day | ORAL | 1 refills | Status: DC

## 2023-12-24 MED ORDER — ATORVASTATIN CALCIUM 10 MG PO TABS: 10.0000 mg | ORAL_TABLET | Freq: Every day | ORAL | 1 refills | Status: AC

## 2023-12-24 MED ORDER — HYDROCHLOROTHIAZIDE 12.5 MG PO CAPS: 12.5000 mg | ORAL_CAPSULE | Freq: Every day | ORAL | 1 refills | Status: AC

## 2023-12-24 MED ORDER — LEVOTHYROXINE SODIUM 88 MCG PO TABS: 88.0000 ug | ORAL_TABLET | Freq: Every day | ORAL | 1 refills | Status: AC

## 2023-12-24 NOTE — Progress Notes (Signed)
 Date:  12/24/2023   Name:  Stacey Casey   DOB:  11/28/1965   MRN:  [id number]   Chief Complaint: Hypertension, Hypothyroidism, and Hyperlipidemia  Hypertension This is a chronic problem. The current episode started more than 1 year ago. The problem has been gradually improving since onset. The problem is controlled. Pertinent negatives include no anxiety, blurred vision, chest pain, headaches, malaise/fatigue, neck pain, orthopnea, palpitations, peripheral edema, PND, shortness of breath or sweats. There are no associated agents to hypertension. Risk factors for coronary artery disease include dyslipidemia. Past treatments include diuretics and ACE inhibitors. The current treatment provides moderate improvement. There is no history of CAD/MI or CVA. There is no history of chronic renal disease, a hypertension causing med or renovascular disease.  Hyperlipidemia This is a chronic problem. The current episode started more than 1 year ago. The problem is controlled. Recent lipid tests were reviewed and are normal. She has no history of chronic renal disease. Pertinent negatives include no chest pain, focal sensory loss, focal weakness, leg pain, myalgias or shortness of breath. Current antihyperlipidemic treatment includes statins. The current treatment provides moderate improvement of lipids.    Lab Results  Component Value Date   NA 143 08/09/2023   K 3.9 08/09/2023   CO2 25 08/09/2023   GLUCOSE 86 08/09/2023   BUN 24 08/09/2023   CREATININE 0.92 08/09/2023   CALCIUM  9.8 08/09/2023   EGFR 74 08/09/2023   GFRNONAA 79 01/27/2020   Lab Results  Component Value Date   CHOL 178 08/09/2023   HDL 52 08/09/2023   LDLCALC 108 (H) 08/09/2023   TRIG 97 08/09/2023   Lab Results  Component Value Date   TSH 4.410 08/09/2023   No results found for: "HGBA1C" Lab Results  Component Value Date   WBC 7.2 01/27/2020   HGB 11.7 (A) 01/27/2020   HCT 38 01/27/2020   PLT 214 01/27/2020    Lab Results  Component Value Date   ALT 15 01/22/2023   AST 16 01/22/2023   ALKPHOS 82 01/22/2023   BILITOT 0.4 01/22/2023   Lab Results  Component Value Date   VD25OH 29 01/27/2020     Review of Systems  Constitutional: Negative.  Negative for chills, fatigue, fever, malaise/fatigue and unexpected weight change.  HENT:  Negative for congestion, ear discharge, ear pain, rhinorrhea, sinus pressure, sneezing and sore throat.   Eyes:  Negative for blurred vision.  Respiratory:  Negative for cough, shortness of breath, wheezing and stridor.   Cardiovascular:  Negative for chest pain, palpitations, orthopnea and PND.  Gastrointestinal:  Negative for abdominal pain, blood in stool, constipation, diarrhea and nausea.  Genitourinary:  Negative for dysuria, flank pain, frequency, hematuria, urgency and vaginal discharge.  Musculoskeletal:  Negative for arthralgias, back pain, myalgias and neck pain.  Skin:  Negative for rash.  Neurological:  Negative for dizziness, focal weakness, weakness and headaches.  Hematological:  Negative for adenopathy. Does not bruise/bleed easily.  Psychiatric/Behavioral:  Negative for dysphoric mood. The patient is not nervous/anxious.     Patient Active Problem List   Diagnosis Date Noted   Bilateral carpal tunnel syndrome 08/13/2023   Colon cancer screening    Hypertension 03/06/2021   Hypothyroidism 03/06/2021   Vitamin D deficiency 03/06/2021   History of miscarriage 03/06/2021    Allergies  Allergen Reactions   Azithromycin Diarrhea    Past Surgical History:  Procedure Laterality Date   arthroscopy of knee     BACK SURGERY  ruptured disc in L5-S1   CESAREAN SECTION  03/2002   COLONOSCOPY WITH PROPOFOL  N/A 11/21/2021   Procedure: COLONOSCOPY WITH PROPOFOL ;  Surgeon: Marnee Sink, MD;  Location: [facility] ENDOSCOPY;  Service: Endoscopy;  Laterality: N/A;    Social History   Tobacco Use   Smoking status: Never   Smokeless tobacco: Never   Vaping Use   Vaping status: Never Used  Substance Use Topics   Alcohol use: Yes    Comment: ocassionally   Drug use: Not Currently     Medication list has been reviewed and updated.  Current Meds  Medication Sig   atorvastatin  (LIPITOR) 10 MG tablet Take 1 tablet (10 mg total) by mouth daily.   calcium  carbonate (OS-CAL) 600 MG TABS tablet Take 600 mg by mouth daily with breakfast.   hydrochlorothiazide  (MICROZIDE ) 12.5 MG capsule Take 1 capsule (12.5 mg total) by mouth daily.   levothyroxine  (SYNTHROID ) 88 MCG tablet Take 1 tablet (88 mcg total) by mouth daily.   lisinopril  (ZESTRIL ) 5 MG tablet Take 1 tablet (5 mg total) by mouth daily.   Multiple Vitamins-Minerals (MULTIVITAMIN WOMENS 50+ ADV PO) Take by mouth.       12/24/2023    3:31 PM 08/09/2023    8:10 AM 12/06/2022    2:38 PM 12/06/2022    2:36 PM  GAD 7 : Generalized Anxiety Score  Nervous, Anxious, on Edge 0 0 0 0  Control/stop worrying 0 0 0 0  Worry too much - different things 0 0 0 0  Trouble relaxing 0 0 0 0  Restless 0 0 0 0  Easily annoyed or irritable 0 0 0 0  Afraid - awful might happen 0 0 0 0  Total GAD 7 Score 0 0 0 0  Anxiety Difficulty Not difficult at all Not difficult at all Not difficult at all Not difficult at all       12/24/2023    3:31 PM 10/29/2023    8:53 AM 08/13/2023   10:20 AM  Depression screen PHQ 2/9  Decreased Interest 0 0 0  Down, Depressed, Hopeless 0 0 0  PHQ - 2 Score 0 0 0    BP Readings from Last 3 Encounters:  12/24/23 124/80  10/29/23 128/83  08/13/23 120/80    Physical Exam Vitals and nursing note reviewed.  Constitutional:      General: She is not in acute distress.    Appearance: She is not diaphoretic.  HENT:     Head: Normocephalic and atraumatic.     Right Ear: External ear normal.     Left Ear: External ear normal.     Nose: Nose normal.  Eyes:     General:        Right eye: No discharge.        Left eye: No discharge.     Conjunctiva/sclera:  Conjunctivae normal.     Pupils: Pupils are equal, round, and reactive to light.  Neck:     Thyroid : No thyromegaly.     Vascular: No JVD.  Cardiovascular:     Rate and Rhythm: Normal rate and regular rhythm.     Heart sounds: Normal heart sounds. No murmur heard.    No friction rub. No gallop.  Pulmonary:     Effort: Pulmonary effort is normal.     Breath sounds: Normal breath sounds. No wheezing, rhonchi or rales.  Abdominal:     General: Bowel sounds are normal.     Palpations: Abdomen is soft. There  is no mass.     Tenderness: There is no abdominal tenderness. There is no guarding.  Musculoskeletal:        General: Normal range of motion.     Cervical back: Normal range of motion and neck supple.  Lymphadenopathy:     Cervical: No cervical adenopathy.  Skin:    General: Skin is warm and dry.  Neurological:     Mental Status: She is alert.     Deep Tendon Reflexes: Reflexes are normal and symmetric.     Wt Readings from Last 3 Encounters:  12/24/23 174 lb (78.9 kg)  10/29/23 170 lb 9.6 oz (77.4 kg)  08/13/23 172 lb 3.2 oz (78.1 kg)    BP 124/80   Pulse 79   Ht 5\' 4"  (1.626 m)   Wt 174 lb (78.9 kg)   LMP 02/06/2021   SpO2 96%   BMI 29.87 kg/m   Assessment and Plan: 1. Familial hypercholesterolemia Chronic.  Controlled.  Stable.  Asymptomatic.  Tolerating medications well.  Patient denies any myalgias or muscle weakness.  Will continue atorvastatin  10 mg once a day.  Will check CMP for hepatic concerns as well as lipid panel for level of LDL control.  Will recheck patient in 6 months in the meantime we will have reemphasized low-cholesterol low triglyceride dietary guidelines. - atorvastatin  (LIPITOR) 10 MG tablet; Take 1 tablet (10 mg total) by mouth daily.  Dispense: 90 tablet; Refill: 1 - Comprehensive metabolic panel with GFR - Lipid Panel With LDL/HDL Ratio  2. Primary hypertension (Primary) Chronic.  Controlled.  Stable.  Asymptomatic.  Blood pressure is  124/80.  Tolerating medications well.  We will continue lisinopril  5 mg once a day and hydrochlorothiazide  12.5 mg once a day will check comprehensive metabolic panel for electrolytes and GFR.  Will recheck patient in 6 months. - hydrochlorothiazide  (MICROZIDE ) 12.5 MG capsule; Take 1 capsule (12.5 mg total) by mouth daily.  Dispense: 90 capsule; Refill: 1 - lisinopril  (ZESTRIL ) 5 MG tablet; Take 1 tablet (5 mg total) by mouth daily.  Dispense: 90 tablet; Refill: 1 - Comprehensive metabolic panel with GFR  3. Hypothyroidism due to acquired atrophy of thyroid  Chronic.  Controlled.  Stable.  Patient is currently taking supplemental levothyroxine  at 88 mcg daily.  Will check TSH for current level of control and pending results will likely continue current dosing. - levothyroxine  (SYNTHROID ) 88 MCG tablet; Take 1 tablet (88 mcg total) by mouth daily.  Dispense: 90 tablet; Refill: 1 - TSH     Alayne Allis, MD

## 2023-12-25 NOTE — Patient Instructions (Signed)

## 2023-12-26 DIAGNOSIS — E7801 Familial hypercholesterolemia: Secondary | ICD-10-CM | POA: Diagnosis not present

## 2023-12-26 DIAGNOSIS — I10 Essential (primary) hypertension: Secondary | ICD-10-CM | POA: Diagnosis not present

## 2023-12-26 DIAGNOSIS — E034 Atrophy of thyroid (acquired): Secondary | ICD-10-CM | POA: Diagnosis not present

## 2023-12-27 ENCOUNTER — Ambulatory Visit: Payer: Self-pay | Admitting: Family Medicine

## 2023-12-27 LAB — COMPREHENSIVE METABOLIC PANEL WITH GFR
ALT: 16 IU/L (ref 0–32)
AST: 19 IU/L (ref 0–40)
Albumin: 4.5 g/dL (ref 3.8–4.9)
Alkaline Phosphatase: 86 IU/L (ref 44–121)
BUN/Creatinine Ratio: 24 — ABNORMAL HIGH (ref 9–23)
BUN: 21 mg/dL (ref 6–24)
Bilirubin Total: 0.3 mg/dL (ref 0.0–1.2)
CO2: 26 mmol/L (ref 20–29)
Calcium: 9.6 mg/dL (ref 8.7–10.2)
Chloride: 103 mmol/L (ref 96–106)
Creatinine, Ser: 0.88 mg/dL (ref 0.57–1.00)
Globulin, Total: 2.6 g/dL (ref 1.5–4.5)
Glucose: 86 mg/dL (ref 70–99)
Potassium: 4.4 mmol/L (ref 3.5–5.2)
Sodium: 142 mmol/L (ref 134–144)
Total Protein: 7.1 g/dL (ref 6.0–8.5)
eGFR: 77 mL/min/{1.73_m2} (ref >59)

## 2023-12-27 LAB — LIPID PANEL WITH LDL/HDL RATIO
Cholesterol, Total: 154 mg/dL (ref 100–199)
HDL: 51 mg/dL (ref >39)
LDL Chol Calc (NIH): 88 mg/dL (ref 0–99)
LDL/HDL Ratio: 1.7 ratio (ref 0.0–3.2)
Triglycerides: 77 mg/dL (ref 0–149)
VLDL Cholesterol Cal: 15 mg/dL (ref 5–40)

## 2023-12-27 LAB — TSH: TSH: 4.28 u[IU]/mL (ref 0.450–4.500)

## 2024-01-21 ENCOUNTER — Encounter

## 2024-01-29 ENCOUNTER — Ambulatory Visit
Admission: RE | Admit: 2024-01-29 | Discharge: 2024-01-29 | Disposition: A | Discharge status: Home / Self Care | Source: Ambulatory Visit | Attending: Family Medicine | Admitting: Family Medicine

## 2024-01-29 DIAGNOSIS — Z1231 Encounter for screening mammogram for malignant neoplasm of breast: Secondary | ICD-10-CM | POA: Diagnosis not present

## 2024-09-01 ENCOUNTER — Other Ambulatory Visit: Payer: Self-pay

## 2024-09-01 DIAGNOSIS — I10 Essential (primary) hypertension: Secondary | ICD-10-CM

## 2024-09-01 MED ORDER — LISINOPRIL 5 MG PO TABS: 5.0000 mg | ORAL_TABLET | Freq: Every day | ORAL | 0 refills | Status: AC
